# Patient Record
Sex: Male | Born: 1945 | Race: White | Hispanic: No | Marital: Married | Smoking: Former smoker
Health system: Southern US, Community
[De-identification: ages and names within clinical notes are randomized; demographics above are authoritative.]

## PROBLEM LIST (undated history)

## (undated) DIAGNOSIS — I10 Essential (primary) hypertension: Secondary | ICD-10-CM

## (undated) DIAGNOSIS — M069 Rheumatoid arthritis, unspecified: Secondary | ICD-10-CM

## (undated) DIAGNOSIS — J449 Chronic obstructive pulmonary disease, unspecified: Secondary | ICD-10-CM

## (undated) DIAGNOSIS — I509 Heart failure, unspecified: Secondary | ICD-10-CM

## (undated) HISTORY — PX: LEFT HEART CATH: CATH118248

---

## 2001-08-21 ENCOUNTER — Encounter: Payer: Self-pay | Admitting: Occupational Medicine

## 2001-08-21 ENCOUNTER — Encounter: Admission: RE | Admit: 2001-08-21 | Discharge: 2001-08-21 | Payer: Self-pay | Admitting: Occupational Medicine

## 2006-01-04 ENCOUNTER — Encounter: Admission: RE | Admit: 2006-01-04 | Discharge: 2006-01-04 | Payer: Self-pay | Admitting: Sports Medicine

## 2006-01-15 ENCOUNTER — Encounter: Admission: RE | Admit: 2006-01-15 | Discharge: 2006-01-15 | Payer: Self-pay | Admitting: Sports Medicine

## 2018-02-16 DIAGNOSIS — I5031 Acute diastolic (congestive) heart failure: Secondary | ICD-10-CM | POA: Diagnosis not present

## 2018-02-16 DIAGNOSIS — J9601 Acute respiratory failure with hypoxia: Secondary | ICD-10-CM | POA: Diagnosis not present

## 2018-02-16 DIAGNOSIS — Z720 Tobacco use: Secondary | ICD-10-CM | POA: Diagnosis not present

## 2018-02-16 DIAGNOSIS — R2243 Localized swelling, mass and lump, lower limb, bilateral: Secondary | ICD-10-CM | POA: Diagnosis not present

## 2018-02-16 DIAGNOSIS — R0602 Shortness of breath: Secondary | ICD-10-CM

## 2018-02-16 DIAGNOSIS — J441 Chronic obstructive pulmonary disease with (acute) exacerbation: Secondary | ICD-10-CM | POA: Diagnosis not present

## 2018-02-17 DIAGNOSIS — R2243 Localized swelling, mass and lump, lower limb, bilateral: Secondary | ICD-10-CM | POA: Diagnosis not present

## 2018-02-17 DIAGNOSIS — J9601 Acute respiratory failure with hypoxia: Secondary | ICD-10-CM | POA: Diagnosis not present

## 2018-02-17 DIAGNOSIS — Z720 Tobacco use: Secondary | ICD-10-CM | POA: Diagnosis not present

## 2018-02-17 DIAGNOSIS — J441 Chronic obstructive pulmonary disease with (acute) exacerbation: Secondary | ICD-10-CM | POA: Diagnosis not present

## 2018-02-17 DIAGNOSIS — I5031 Acute diastolic (congestive) heart failure: Secondary | ICD-10-CM | POA: Diagnosis not present

## 2019-01-01 ENCOUNTER — Other Ambulatory Visit: Payer: Self-pay

## 2019-01-01 ENCOUNTER — Encounter (HOSPITAL_COMMUNITY): Payer: Self-pay | Admitting: Internal Medicine

## 2019-01-01 ENCOUNTER — Inpatient Hospital Stay (HOSPITAL_COMMUNITY)
Admission: AD | Admit: 2019-01-01 | Discharge: 2019-01-05 | DRG: 191 | Disposition: A | Discharge status: Home / Self Care | Payer: Medicare Other | Source: Other Acute Inpatient Hospital | Attending: Internal Medicine | Admitting: Internal Medicine

## 2019-01-01 DIAGNOSIS — Z20828 Contact with and (suspected) exposure to other viral communicable diseases: Secondary | ICD-10-CM

## 2019-01-01 DIAGNOSIS — E785 Hyperlipidemia, unspecified: Secondary | ICD-10-CM | POA: Diagnosis not present

## 2019-01-01 DIAGNOSIS — I5033 Acute on chronic diastolic (congestive) heart failure: Secondary | ICD-10-CM | POA: Diagnosis present

## 2019-01-01 DIAGNOSIS — I10 Essential (primary) hypertension: Secondary | ICD-10-CM | POA: Diagnosis present

## 2019-01-01 DIAGNOSIS — Z8249 Family history of ischemic heart disease and other diseases of the circulatory system: Secondary | ICD-10-CM

## 2019-01-01 DIAGNOSIS — Z79899 Other long term (current) drug therapy: Secondary | ICD-10-CM | POA: Diagnosis not present

## 2019-01-01 DIAGNOSIS — Z833 Family history of diabetes mellitus: Secondary | ICD-10-CM

## 2019-01-01 DIAGNOSIS — J441 Chronic obstructive pulmonary disease with (acute) exacerbation: Principal | ICD-10-CM | POA: Diagnosis present

## 2019-01-01 DIAGNOSIS — Z9981 Dependence on supplemental oxygen: Secondary | ICD-10-CM | POA: Diagnosis not present

## 2019-01-01 DIAGNOSIS — I5032 Chronic diastolic (congestive) heart failure: Secondary | ICD-10-CM | POA: Diagnosis not present

## 2019-01-01 DIAGNOSIS — M791 Myalgia, unspecified site: Secondary | ICD-10-CM | POA: Diagnosis not present

## 2019-01-01 DIAGNOSIS — Z20822 Contact with and (suspected) exposure to covid-19: Secondary | ICD-10-CM | POA: Diagnosis present

## 2019-01-01 DIAGNOSIS — R6889 Other general symptoms and signs: Secondary | ICD-10-CM

## 2019-01-01 DIAGNOSIS — Z888 Allergy status to other drugs, medicaments and biological substances status: Secondary | ICD-10-CM | POA: Diagnosis not present

## 2019-01-01 DIAGNOSIS — J9611 Chronic respiratory failure with hypoxia: Secondary | ICD-10-CM | POA: Diagnosis present

## 2019-01-01 DIAGNOSIS — Z03818 Encounter for observation for suspected exposure to other biological agents ruled out: Secondary | ICD-10-CM | POA: Diagnosis not present

## 2019-01-01 DIAGNOSIS — F17200 Nicotine dependence, unspecified, uncomplicated: Secondary | ICD-10-CM | POA: Diagnosis present

## 2019-01-01 DIAGNOSIS — M069 Rheumatoid arthritis, unspecified: Secondary | ICD-10-CM | POA: Diagnosis not present

## 2019-01-01 DIAGNOSIS — K5900 Constipation, unspecified: Secondary | ICD-10-CM | POA: Diagnosis not present

## 2019-01-01 DIAGNOSIS — I110 Hypertensive heart disease with heart failure: Secondary | ICD-10-CM | POA: Diagnosis present

## 2019-01-01 HISTORY — DX: Heart failure, unspecified: I50.9

## 2019-01-01 HISTORY — DX: Rheumatoid arthritis, unspecified: M06.9

## 2019-01-01 HISTORY — DX: Chronic obstructive pulmonary disease, unspecified: J44.9

## 2019-01-01 HISTORY — DX: Essential (primary) hypertension: I10

## 2019-01-01 LAB — MRSA PCR SCREENING: MRSA by PCR: NEGATIVE

## 2019-01-01 MED ORDER — CARVEDILOL 3.125 MG PO TABS
3.1250 mg | ORAL_TABLET | Freq: Two times a day (BID) | ORAL | Status: DC
  Administered 2019-01-02 – 2019-01-05 (×7): 3.125 mg via ORAL
  Filled 2019-01-01 (×7): qty 1

## 2019-01-01 MED ORDER — ACETAMINOPHEN 325 MG PO TABS
650.0000 mg | ORAL_TABLET | Freq: Four times a day (QID) | ORAL | Status: DC | PRN
  Administered 2019-01-02: 650 mg via ORAL
  Filled 2019-01-01: qty 2

## 2019-01-01 MED ORDER — HYDRALAZINE HCL 20 MG/ML IJ SOLN
10.0000 mg | INTRAMUSCULAR | Status: DC | PRN
  Administered 2019-01-01 – 2019-01-02 (×2): 10 mg via INTRAVENOUS
  Filled 2019-01-01 (×2): qty 1

## 2019-01-01 MED ORDER — ATORVASTATIN CALCIUM 10 MG PO TABS
10.0000 mg | ORAL_TABLET | Freq: Every day | ORAL | Status: DC
  Administered 2019-01-02 – 2019-01-04 (×3): 10 mg via ORAL
  Filled 2019-01-01 (×3): qty 1

## 2019-01-01 MED ORDER — ENOXAPARIN SODIUM 40 MG/0.4ML ~~LOC~~ SOLN
40.0000 mg | SUBCUTANEOUS | Status: DC
  Administered 2019-01-01: 40 mg via SUBCUTANEOUS
  Filled 2019-01-01: qty 0.4

## 2019-01-01 MED ORDER — FUROSEMIDE 40 MG PO TABS
40.0000 mg | ORAL_TABLET | Freq: Every day | ORAL | Status: DC
  Administered 2019-01-02 – 2019-01-05 (×4): 40 mg via ORAL
  Filled 2019-01-01 (×5): qty 1

## 2019-01-01 MED ORDER — HYDROCODONE-ACETAMINOPHEN 5-325 MG PO TABS
1.0000 | ORAL_TABLET | Freq: Four times a day (QID) | ORAL | Status: DC | PRN
  Administered 2019-01-01 – 2019-01-04 (×10): 1 via ORAL
  Filled 2019-01-01 (×11): qty 1

## 2019-01-01 MED ORDER — MOMETASONE FURO-FORMOTEROL FUM 200-5 MCG/ACT IN AERO
2.0000 | INHALATION_SPRAY | Freq: Two times a day (BID) | RESPIRATORY_TRACT | Status: DC
  Administered 2019-01-01 – 2019-01-05 (×8): 2 via RESPIRATORY_TRACT
  Filled 2019-01-01 (×2): qty 8.8

## 2019-01-01 MED ORDER — IPRATROPIUM-ALBUTEROL 20-100 MCG/ACT IN AERS
1.0000 | INHALATION_SPRAY | Freq: Four times a day (QID) | RESPIRATORY_TRACT | Status: DC
  Administered 2019-01-01 – 2019-01-04 (×13): 1 via RESPIRATORY_TRACT
  Filled 2019-01-01: qty 4

## 2019-01-01 MED ORDER — GUAIFENESIN-DM 100-10 MG/5ML PO SYRP
10.0000 mL | ORAL_SOLUTION | ORAL | Status: DC | PRN
  Administered 2019-01-02 – 2019-01-05 (×9): 10 mL via ORAL
  Filled 2019-01-01 (×11): qty 10

## 2019-01-01 MED ORDER — ORAL CARE MOUTH RINSE
15.0000 mL | Freq: Two times a day (BID) | OROMUCOSAL | Status: DC
  Administered 2019-01-02 – 2019-01-05 (×5): 15 mL via OROMUCOSAL

## 2019-01-01 MED ORDER — ALBUTEROL SULFATE HFA 108 (90 BASE) MCG/ACT IN AERS
1.0000 | INHALATION_SPRAY | RESPIRATORY_TRACT | Status: DC | PRN
  Administered 2019-01-02 – 2019-01-03 (×4): 2 via RESPIRATORY_TRACT
  Filled 2019-01-01: qty 6.7

## 2019-01-01 NOTE — H&P (Addendum)
History and Physical    William Peterson ZMO:[id number] DOB: 09/24/1946 DOA: 01/01/2019  PCP: Patient, No Pcp Per  Patient coming from: [facility]  I have personally briefly reviewed patient's old medical records in [facility]  Chief Complaint: SOB  HPI: William Peterson is a 73 y.o. male with medical history significant of COPD on 2L home O2 at baseline, HTN, CHF, RA not on immunosuppressives.  Patient presents to the ED at [facility] with 1 day h/o SOB, cough, myalgias.  No subjective fever (objectively 100.5 in ED).  Chronic SOB but significantly worse symptoms onset last night.  Cough with clear sputum production.  CP in L anterior and lateral chest only with coughing, severe and sharp when it occurs.  Improved slightly with norco.  No N/V abd pain.  No known sick contacts, no recent travel outside of area.   ED Course: Tm 100.5, RR 26.  WBC 13.9k, CXR neg.  Bicarb 38.  CRP and LDH elevated, procalcitonin neg.  Trop neg.  Pro BNP wnl.  Flu swab neg.  Given nebs and steroids.  Patient transferred to [facility] for COVID r/o as apparently [facility] is now transferring all patients with possible COVID.   Review of Systems: As per HPI otherwise 10 point review of systems negative.   Past Medical History:  Diagnosis Date  . CHF (congestive heart failure) (HCC)   . COPD (chronic obstructive pulmonary disease) (HCC)    2L O2 at baseline  . HTN (hypertension)   . RA (rheumatoid arthritis) (HCC)    Not on immunosuppresants currently it looks like    Past Surgical History:  Procedure Laterality Date  . LEFT HEART CATH       reports that he has been smoking. He does not have any smokeless tobacco history on file. He reports previous alcohol use. He reports that he does not use drugs.  Allergies  Allergen Reactions  . Naproxen Sodium Rash    Family History  Problem Relation Age of Onset  . Heart disease Father   . Diabetes Brother   . Cancer Maternal Grandmother   . Hypertension Neg Hx   .  Stroke Neg Hx      Prior to Admission medications   Medication Sig Start Date End Date Taking? Authorizing Provider  acetaminophen-codeine (TYLENOL #3) 300-30 MG tablet Take 2 tablets by mouth every 4 (four) hours as needed for pain. 06/03/16  Yes [provider]  albuterol (PROVENTIL HFA;VENTOLIN HFA) 108 (90 Base) MCG/ACT inhaler Inhale 2 puffs into the lungs every 6 (six) hours as needed for wheezing or shortness of breath.   Yes [provider]  albuterol (PROVENTIL) (2.5 MG/3ML) 0.083% nebulizer solution Take 2.5 mg by nebulization every 6 (six) hours as needed for wheezing or shortness of breath.   Yes [provider]  atorvastatin (LIPITOR) 10 MG tablet Take 10 mg by mouth daily. 08/04/17  Yes [provider]  carvedilol (COREG) 3.125 MG tablet Take 3.125 mg by mouth 2 (two) times daily. 02/17/18  Yes [provider]  furosemide (LASIX) 20 MG tablet Take 40 mg by mouth daily.   Yes [provider]    Physical Exam: Vitals:   01/01/19 2100 01/01/19 2133  Temp:  97.6 F (36.4 C)  TempSrc:  Axillary  Weight: (!) 152.4 kg   Height: 5\' 10"  (1.778 m)     Constitutional: NAD, calm, comfortable Eyes: PERRL, lids and conjunctivae normal ENMT: Mucous membranes are moist. Posterior pharynx clear of any  exudate or lesions.Normal dentition.  Neck: normal, supple, no masses, no thyromegaly Respiratory: Wheezing, mild tachypnea Cardiovascular: Regular rate and rhythm, no murmurs / rubs / gallops. No extremity edema. 2+ pedal pulses. No carotid bruits.  Abdomen: no tenderness, no masses palpated. No hepatosplenomegaly. Bowel sounds positive.  Musculoskeletal: no clubbing / cyanosis. No joint deformity upper and lower extremities. Good ROM, no contractures. Normal muscle tone.  Skin: no rashes, lesions, ulcers. No induration Neurologic: CN 2-12 grossly intact. Sensation intact, DTR normal. Strength 5/5 in all 4.  Psychiatric: Normal judgment  and insight. Alert and oriented x 3. Normal mood.   Date: @TODAY @ 10:24 PM  Patient Isolation: Droplet+Contact HCP PPE: Wearing PPE N95 mask + eye protection + Gown + Gloves  Patient PPE: None   Labs on Admission: I have personally reviewed following labs and imaging studies  CBC: No results for input(s): WBC, NEUTROABS, HGB, HCT, MCV, PLT in the last 168 hours. Basic Metabolic Panel: No results for input(s): NA, K, CL, CO2, GLUCOSE, BUN, CREATININE, CALCIUM, MG, PHOS in the last 168 hours. GFR: CrCl cannot be calculated (No successful lab value found.). Liver Function Tests: No results for input(s): AST, ALT, ALKPHOS, BILITOT, PROT, ALBUMIN in the last 168 hours. No results for input(s): LIPASE, AMYLASE in the last 168 hours. No results for input(s): AMMONIA in the last 168 hours. Coagulation Profile: No results for input(s): INR, PROTIME in the last 168 hours. Cardiac Enzymes: No results for input(s): CKTOTAL, CKMB, CKMBINDEX, TROPONINI in the last 168 hours. BNP (last 3 results) No results for input(s): PROBNP in the last 8760 hours. HbA1C: No results for input(s): HGBA1C in the last 72 hours. CBG: No results for input(s): GLUCAP in the last 168 hours. Lipid Profile: No results for input(s): CHOL, HDL, LDLCALC, TRIG, CHOLHDL, LDLDIRECT in the last 72 hours. Thyroid Function Tests: No results for input(s): TSH, T4TOTAL, FREET4, T3FREE, THYROIDAB in the last 72 hours. Anemia Panel: No results for input(s): VITAMINB12, FOLATE, FERRITIN, TIBC, IRON, RETICCTPCT in the last 72 hours. Urine analysis: No results found for: COLORURINE, APPEARANCEUR, LABSPEC, PHURINE, GLUCOSEU, HGBUR, BILIRUBINUR, KETONESUR, PROTEINUR, UROBILINOGEN, NITRITE, LEUKOCYTESUR  Radiological Exams on Admission: No results found.  EKG: Independently reviewed.  Assessment/Plan Principal Problem:   Suspected Covid-19 Virus Infection Active Problems:   COPD with acute exacerbation (HCC)   Chronic  diastolic CHF (congestive heart failure) (HCC)   Chronic respiratory failure with hypoxia (HCC)   HTN (hypertension)    1. Ruleout COVID-19 1. COVID pathway 2. RVP and covid testing ordered 3. Monitor pulse ox 4. Robitussin 5. PRN norco for the pleuritic CP 2. COPD exacerbation - 1. Atrovent Scheduled 2. Albuterol PRN 3. Dulera scheduled 4. Avoiding systemic steroids 3. HTN - 1. Continue coreg 2. Add PRN hydralazine if needed 4. CHF - 1. Continue home lasix  DVT prophylaxis: Lovenox Code Status: Full Family Communication: No family in room Disposition Plan: Home after admit Consults called: None Admission status: Place in 84obs    Cleotis Sparr M. DO [facility]  How to contact the [facility] Attending or Consulting provider 7A - 7P or covering provider during after hours 7P -7A, for this patient?  1. Check the care team in [facility] and look for a) attending/consulting [facility] provider listed and b) the [facility] team listed 2. Log into [email or web address]  Amion Physician Scheduling and messaging for groups and whole hospitals  On call and physician scheduling software for group practices, residents, hospitalists and other medical providers for call, clinic, rotation and  shift schedules. On[employer] is a hospital-wide system for scheduling doctors and paging doctors on call. EasyPlot is for scientific plotting and data analysis.  [email or web address]  and use [place]'s universal password to access. If you do not have the password, please contact the hospital operator.  3. Locate the [facility] provider you are looking for under [facility] and page to a number that you can be directly reached. 4. If you still have difficulty reaching the provider, please page the [facility] (Director on Call) for the Hospitalists listed on amion for assistance.  01/01/2019, 9:34 PM

## 2019-01-01 NOTE — Plan of Care (Signed)
  Problem: Education: Goal: Knowledge of General Education information will improve Description: Including pain rating scale, medication(s)/side effects and non-pharmacologic comfort measures Outcome: Progressing   Problem: Nutrition: Goal: Adequate nutrition will be maintained Outcome: Progressing   Problem: Elimination: Goal: Will not experience complications related to urinary retention Outcome: Progressing   Problem: Pain Managment: Goal: General experience of comfort will improve Outcome: Progressing   Problem: Safety: Goal: Ability to remain free from injury will improve Outcome: Progressing   Problem: Skin Integrity: Goal: Risk for impaired skin integrity will decrease Outcome: Progressing   

## 2019-01-02 ENCOUNTER — Encounter (HOSPITAL_COMMUNITY): Payer: Self-pay

## 2019-01-02 DIAGNOSIS — Z888 Allergy status to other drugs, medicaments and biological substances status: Secondary | ICD-10-CM | POA: Diagnosis not present

## 2019-01-02 DIAGNOSIS — R6889 Other general symptoms and signs: Secondary | ICD-10-CM | POA: Diagnosis not present

## 2019-01-02 DIAGNOSIS — I5032 Chronic diastolic (congestive) heart failure: Secondary | ICD-10-CM | POA: Diagnosis present

## 2019-01-02 DIAGNOSIS — M069 Rheumatoid arthritis, unspecified: Secondary | ICD-10-CM | POA: Diagnosis present

## 2019-01-02 DIAGNOSIS — K5900 Constipation, unspecified: Secondary | ICD-10-CM | POA: Diagnosis not present

## 2019-01-02 DIAGNOSIS — Z79899 Other long term (current) drug therapy: Secondary | ICD-10-CM | POA: Diagnosis not present

## 2019-01-02 DIAGNOSIS — J9611 Chronic respiratory failure with hypoxia: Secondary | ICD-10-CM | POA: Diagnosis present

## 2019-01-02 DIAGNOSIS — Z833 Family history of diabetes mellitus: Secondary | ICD-10-CM | POA: Diagnosis not present

## 2019-01-02 DIAGNOSIS — I110 Hypertensive heart disease with heart failure: Secondary | ICD-10-CM | POA: Diagnosis present

## 2019-01-02 DIAGNOSIS — Z9981 Dependence on supplemental oxygen: Secondary | ICD-10-CM | POA: Diagnosis not present

## 2019-01-02 DIAGNOSIS — Z8249 Family history of ischemic heart disease and other diseases of the circulatory system: Secondary | ICD-10-CM | POA: Diagnosis not present

## 2019-01-02 DIAGNOSIS — J441 Chronic obstructive pulmonary disease with (acute) exacerbation: Secondary | ICD-10-CM | POA: Diagnosis present

## 2019-01-02 DIAGNOSIS — E785 Hyperlipidemia, unspecified: Secondary | ICD-10-CM | POA: Diagnosis present

## 2019-01-02 DIAGNOSIS — F17200 Nicotine dependence, unspecified, uncomplicated: Secondary | ICD-10-CM | POA: Diagnosis present

## 2019-01-02 DIAGNOSIS — M791 Myalgia, unspecified site: Secondary | ICD-10-CM | POA: Diagnosis present

## 2019-01-02 DIAGNOSIS — I10 Essential (primary) hypertension: Secondary | ICD-10-CM | POA: Diagnosis not present

## 2019-01-02 DIAGNOSIS — Z03818 Encounter for observation for suspected exposure to other biological agents ruled out: Secondary | ICD-10-CM | POA: Diagnosis not present

## 2019-01-02 LAB — RESPIRATORY PANEL BY PCR

## 2019-01-02 LAB — CBC WITH DIFFERENTIAL/PLATELET
ABS IMMATURE GRANULOCYTES: 0.06 10*3/uL (ref 0.00–0.07)
BASOS PCT: 0 %
Basophils Absolute: 0 10*3/uL (ref 0.0–0.1)
Eosinophils Absolute: 0 10*3/uL (ref 0.0–0.5)
Eosinophils Relative: 0 %
HCT: 41.6 % (ref 39.0–52.0)
Hemoglobin: 12.8 g/dL — ABNORMAL LOW (ref 13.0–17.0)
Immature Granulocytes: 1 %
Lymphocytes Relative: 6 %
Lymphs Abs: 0.6 10*3/uL — ABNORMAL LOW (ref 0.7–4.0)
MCH: 29.2 pg (ref 26.0–34.0)
MCHC: 30.8 g/dL (ref 30.0–36.0)
MCV: 94.8 fL (ref 80.0–100.0)
Monocytes Absolute: 0.3 10*3/uL (ref 0.1–1.0)
Monocytes Relative: 3 %
NEUTROS ABS: 9.2 10*3/uL — AB (ref 1.7–7.7)
NEUTROS PCT: 90 %
Platelets: 246 10*3/uL (ref 150–400)
RBC: 4.39 MIL/uL (ref 4.22–5.81)
RDW: 13.6 % (ref 11.5–15.5)
WBC: 10.1 10*3/uL (ref 4.0–10.5)
nRBC: 0 % (ref 0.0–0.2)

## 2019-01-02 LAB — COMPREHENSIVE METABOLIC PANEL
ALT: 12 U/L (ref 0–44)
AST: 18 U/L (ref 15–41)
Albumin: 2.9 g/dL — ABNORMAL LOW (ref 3.5–5.0)
Alkaline Phosphatase: 54 U/L (ref 38–126)
Anion gap: 9 (ref 5–15)
BUN: 23 mg/dL (ref 8–23)
CO2: 28 mmol/L (ref 22–32)
Calcium: 8.5 mg/dL — ABNORMAL LOW (ref 8.9–10.3)
Chloride: 101 mmol/L (ref 98–111)
Creatinine, Ser: 0.78 mg/dL (ref 0.61–1.24)
GFR calc non Af Amer: >60 mL/min (ref >60)
Glucose, Bld: 132 mg/dL — ABNORMAL HIGH (ref 70–99)
Potassium: 3.6 mmol/L (ref 3.5–5.1)
Sodium: 138 mmol/L (ref 135–145)
Total Bilirubin: 1.1 mg/dL (ref 0.3–1.2)
Total Protein: 6.5 g/dL (ref 6.5–8.1)

## 2019-01-02 LAB — C-REACTIVE PROTEIN: CRP: 14.6 mg/dL — ABNORMAL HIGH (ref <1.0)

## 2019-01-02 MED ORDER — ENOXAPARIN SODIUM 80 MG/0.8ML ~~LOC~~ SOLN
80.0000 mg | SUBCUTANEOUS | Status: DC
  Administered 2019-01-02 – 2019-01-04 (×3): 80 mg via SUBCUTANEOUS
  Filled 2019-01-02 (×3): qty 0.8

## 2019-01-02 MED ORDER — DM-GUAIFENESIN ER 30-600 MG PO TB12
1.0000 | ORAL_TABLET | Freq: Two times a day (BID) | ORAL | Status: DC
  Administered 2019-01-02 – 2019-01-05 (×7): 1 via ORAL
  Filled 2019-01-02 (×7): qty 1

## 2019-01-02 MED ORDER — METHYLPREDNISOLONE SODIUM SUCC 40 MG IJ SOLR
40.0000 mg | Freq: Two times a day (BID) | INTRAMUSCULAR | Status: DC
  Administered 2019-01-02 – 2019-01-04 (×4): 40 mg via INTRAVENOUS
  Filled 2019-01-02 (×4): qty 1

## 2019-01-02 NOTE — Progress Notes (Signed)
At 1110, I was given verbal permission by the pt to call and update William Peterson of his current condition.

## 2019-01-02 NOTE — Progress Notes (Signed)
At 1330, I was given permission by the pt to update his daughter, Geri Seminole, regarding his condition.

## 2019-01-02 NOTE — Care Management CC44 (Signed)
Condition Code 44 Documentation Completed  Patient Details  Name: William Peterson MRN: [id number] Date of Birth: 05-30-1946   Condition Code 44 given:  Yes Patient signature on Condition Code 44 notice:  Yes Documentation of 2 MD's agreement:  Yes Code 44 added to claim:  Yes    Rilea Arutyunyan, RN 01/02/2019, 11:44 AM

## 2019-01-02 NOTE — Care Management Obs Status (Signed)
MEDICARE OBSERVATION STATUS NOTIFICATION   Patient Details  Name: NAWEED DOLLARHIDE MRN: [id number] Date of Birth: 04-Feb-1946   Medicare Observation Status Notification Given:  Yes    Geni Bers, RN 01/02/2019, 11:45 AM

## 2019-01-02 NOTE — Progress Notes (Signed)
PROGRESS NOTE    William BarreRobert L Peterson  HYQ:[id number] DOB: [birth year] DOA: 01/01/2019 PCP: William Peterson, No Pcp Per   Brief Narrative:  73 y.o. male with medical history significant of COPD on 2L home O2 at baseline, HTN, CHF, RA not on immunosuppressives.  [facility]/o SOB, cough, myalgias.  No subjective fever (objectively 100.5 in ED).  Chronic SOB but significantly worse symptoms onset last night.  Cough with clear sputum production.  CP in L anterior and lateral chest only with coughing, severe and sharp when it occurs.  Improved slightly with norco.  No N/V abd pain.  No known sick contacts, no recent travel outside of area.   ED Course: Tm 100.5, RR 26.  WBC 13.9k, CXR neg.  Bicarb 38.  CRP and LDH elevated, procalcitonin neg.  Trop neg.  Pro BNP wnl.  Flu swab neg.  Given nebs and steroids.  [facility] as apparently [facility] is now transferring all patients with possible COVID.  Assessment & Plan:   Principal Problem:   Suspected Covid-19 Virus Infection Active Problems:   COPD with acute exacerbation (HCC)   Chronic diastolic CHF (congestive heart failure) (HCC)   Chronic respiratory failure with hypoxia (HCC)   HTN (hypertension)   #1 COPD exacerbation William Peterson continues to feel short of breath and have cough productive of clear phlegm.  Flu negative at [place]... RSV panel negative. Continue albuterol HFA I will add Combivent continue Dulera At baseline William Peterson has 2 L of oxygen at home COVID-19 pending.  #2 CHF increase Lasix 40 mg twice a day for 2 days.  William Peterson takes Lasix 40 mg at home.  I do not have an echo on him in epic.  [facility].  #3 hypertension continue Coreg  #4 hyperlipidemia continue Lipitor  PPE gown gloves mask eye shields hair shield  were used  Estimated body mass index is 49.06 kg/m as calculated from the following:   Height as of this encounter: 5\' 10"   (1.778 m).   Weight as of this encounter: 155.1 kg.  DVT prophylaxis: lovenox Code Statusfull Family Communication: None  disposition Plan: Pending clinical improvement COVID-19 pending William Peterson comes from home.  Consultants:   None  Procedures: None Antimicrobials: None  Subjective: Complaints of shortness of breath coughing up white phlegm clear phlegm  Objective: Vitals:   01/02/19 0540 01/02/19 0648 01/02/19 0818 01/02/19 1207  BP: (!) 183/82 (!) 115/55 134/79 (!) 144/70  Pulse: 85 96 90 74  Resp: 18 18  19   Temp: 97.7 F (36.5 C)   97.7 F (36.5 C)  TempSrc: Oral   Oral  SpO2: 96% 95% 98% 97%  Weight:      Height:        Intake/Output Summary (Last 24 hours) at 01/02/2019 1220 Last data filed at 01/02/2019 1103 Gross per 24 hour  Intake 270 ml  Output 1 ml  Net 269 ml   Filed Weights   01/01/19 2100 01/02/19 0200  Weight: (!) 152.4 kg (!) 155.1 kg    Examination:  General exam: Appears calm and comfortable  Respiratory system: Scattered wheezing decreased breath sounds at the bases to auscultation. Respiratory effort normal. Cardiovascular system: S1 & S2 heard, RRR. No JVD, murmurs, rubs, gallops or clicks. No pedal edema. Gastrointestinal system: Abdomen is nondistended, soft and nontender. No organomegaly or masses felt. Normal bowel sounds heard. Central nervous system: Alert and oriented. No  focal neurological deficits. Extremities 1+ pitting edema skin: No rashes, lesions or ulcers Psychiatry: Judgement and insight appear normal. Mood & affect appropriate.     Data Reviewed: I have personally reviewed following labs and imaging studies  CBC: Recent Labs  Lab 01/02/19 0354  WBC 10.1  NEUTROABS 9.2*  HGB 12.8*  HCT 41.6  MCV 94.8  PLT 246   Basic Metabolic Panel: Recent Labs  Lab 01/02/19 0354  NA 138  K 3.6  CL 101  CO2 28  GLUCOSE 132*  BUN 23  CREATININE 0.78  CALCIUM 8.5*   GFR: Estimated Creatinine Clearance: 126.7 mL/min  (by C-G formula based on SCr of 0.78 mg/dL). Liver Function Tests: Recent Labs  Lab 01/02/19 0354  AST 18  ALT 12  ALKPHOS 54  BILITOT 1.1  PROT 6.5  ALBUMIN 2.9*   No results for input(s): LIPASE, AMYLASE in the last 168 hours. No results for input(s): AMMONIA in the last 168 hours. Coagulation Profile: No results for input(s): INR, PROTIME in the last 168 hours. Cardiac Enzymes: No results for input(s): CKTOTAL, CKMB, CKMBINDEX, TROPONINI in the last 168 hours. BNP (last 3 results) No results for input(s): PROBNP in the last 8760 hours. HbA1C: No results for input(s): HGBA1C in the last 72 hours. CBG: No results for input(s): GLUCAP in the last 168 hours. Lipid Profile: No results for input(s): CHOL, HDL, LDLCALC, TRIG, CHOLHDL, LDLDIRECT in the last 72 hours. Thyroid Function Tests: No results for input(s): TSH, T4TOTAL, FREET4, T3FREE, THYROIDAB in the last 72 hours. Anemia Panel: No results for input(s): VITAMINB12, FOLATE, FERRITIN, TIBC, IRON, RETICCTPCT in the last 72 hours. Sepsis Labs: No results for input(s): PROCALCITON, LATICACIDVEN in the last 168 hours.  Recent Results (from the past 240 hour(s))  MRSA PCR Screening     Status: None   Collection Time: 01/01/19  8:52 PM  Result Value Ref Range Status   MRSA by PCR NEGATIVE NEGATIVE Final    Comment:        The GeneXpert MRSA Assay (FDA approved for NASAL specimens only), is one component of a comprehensive MRSA colonization surveillance program. It is not intended to diagnose MRSA infection nor to guide or monitor treatment for MRSA infections. Performed at [facility], [address] [place], [place] [id number]   Respiratory Panel by PCR     Status: None   Collection Time: 01/01/19  9:29 PM  Result Value Ref Range Status   Adenovirus NOT DETECTED NOT DETECTED Final   Coronavirus 229E NOT DETECTED NOT DETECTED Final    Comment: (NOTE) The Coronavirus on the Respiratory Panel,  DOES NOT test for the novel  Coronavirus (2019 nCoV)    Coronavirus HKU1 NOT DETECTED NOT DETECTED Final   Coronavirus NL63 NOT DETECTED NOT DETECTED Final   Coronavirus OC43 NOT DETECTED NOT DETECTED Final   Metapneumovirus NOT DETECTED NOT DETECTED Final   Rhinovirus / Enterovirus NOT DETECTED NOT DETECTED Final   Influenza A NOT DETECTED NOT DETECTED Final   Influenza B NOT DETECTED NOT DETECTED Final   Parainfluenza Virus 1 NOT DETECTED NOT DETECTED Final   Parainfluenza Virus 2 NOT DETECTED NOT DETECTED Final   Parainfluenza Virus 3 NOT DETECTED NOT DETECTED Final   Parainfluenza Virus 4 NOT DETECTED NOT DETECTED Final   Respiratory Syncytial Virus NOT DETECTED NOT DETECTED Final   Bordetella pertussis NOT DETECTED NOT DETECTED Final   Chlamydophila pneumoniae NOT DETECTED NOT DETECTED Final   Mycoplasma pneumoniae NOT DETECTED NOT  DETECTED Final    Comment: Performed at [facility], [address] [place], [place] [address]         Radiology Studies: No results found.      Scheduled Meds: . atorvastatin  10 mg Oral q1800  . carvedilol  3.125 mg Oral BID WC  . enoxaparin (LOVENOX) injection  80 mg Subcutaneous Q24H  . furosemide  40 mg Oral Daily  . Ipratropium-Albuterol  1 puff Inhalation Q6H  . mouth rinse  15 mL Mouth Rinse BID  . mometasone-formoterol  2 puff Inhalation BID   Continuous Infusions:   LOS: 1 day     Alwyn Ren, MD [facility]  If 7PM-7AM, please contact night-coverage [email or web address] Password [email or web address] 01/02/2019, 12:20 PM

## 2019-01-02 NOTE — Care Management Obs Status (Signed)
MEDICARE OBSERVATION STATUS NOTIFICATION   Patient Details  Name: CORBAN YOTHER MRN: [id number] Date of Birth: May 03, 1946   Medicare Observation Status Notification Given:  Yes    Geni Bers, RN 01/02/2019, 11:44 AM

## 2019-01-03 LAB — C-REACTIVE PROTEIN: CRP: 8.6 mg/dL — ABNORMAL HIGH (ref <1.0)

## 2019-01-03 LAB — COMPREHENSIVE METABOLIC PANEL
ALT: 14 U/L (ref 0–44)
AST: 23 U/L (ref 15–41)
Albumin: 2.9 g/dL — ABNORMAL LOW (ref 3.5–5.0)
Alkaline Phosphatase: 57 U/L (ref 38–126)
Anion gap: 9 (ref 5–15)
BUN: 29 mg/dL — AB (ref 8–23)
CO2: 28 mmol/L (ref 22–32)
Calcium: 8.6 mg/dL — ABNORMAL LOW (ref 8.9–10.3)
Chloride: 99 mmol/L (ref 98–111)
Creatinine, Ser: 0.78 mg/dL (ref 0.61–1.24)
GFR calc Af Amer: >60 mL/min (ref >60)
GFR calc non Af Amer: >60 mL/min (ref >60)
Glucose, Bld: 91 mg/dL (ref 70–99)
Potassium: 4.3 mmol/L (ref 3.5–5.1)
SODIUM: 136 mmol/L (ref 135–145)
Total Bilirubin: 0.7 mg/dL (ref 0.3–1.2)
Total Protein: 6.8 g/dL (ref 6.5–8.1)

## 2019-01-03 LAB — CBC WITH DIFFERENTIAL/PLATELET
Abs Immature Granulocytes: 0.06 10*3/uL (ref 0.00–0.07)
Basophils Absolute: 0 10*3/uL (ref 0.0–0.1)
Basophils Relative: 0 %
Eosinophils Absolute: 0 10*3/uL (ref 0.0–0.5)
Eosinophils Relative: 0 %
HCT: 42.2 % (ref 39.0–52.0)
Hemoglobin: 13.1 g/dL (ref 13.0–17.0)
Immature Granulocytes: 1 %
Lymphocytes Relative: 5 %
Lymphs Abs: 0.7 10*3/uL (ref 0.7–4.0)
MCH: 29.2 pg (ref 26.0–34.0)
MCHC: 31 g/dL (ref 30.0–36.0)
MCV: 94 fL (ref 80.0–100.0)
Monocytes Absolute: 0.4 10*3/uL (ref 0.1–1.0)
Monocytes Relative: 3 %
Neutro Abs: 11.6 10*3/uL — ABNORMAL HIGH (ref 1.7–7.7)
Neutrophils Relative %: 91 %
Platelets: 282 10*3/uL (ref 150–400)
RBC: 4.49 MIL/uL (ref 4.22–5.81)
RDW: 13.5 % (ref 11.5–15.5)
WBC: 12.8 10*3/uL — ABNORMAL HIGH (ref 4.0–10.5)
nRBC: 0 % (ref 0.0–0.2)

## 2019-01-03 NOTE — Progress Notes (Signed)
[facility] PROGRESS NOTE  RUBLE WILDEN IHK:[id number] DOB: 1946/08/01 DOA: 01/01/2019  PCP: Patient, No Pcp Per  Brief History/Interval Summary: 73 y.o.malewith medical history significant ofCOPD on 2L home O2 at baseline, HTN, CHF, RA not on immunosuppressives.  Patient presented to [facility] emergency department with 1 day history of shortness of breath cough myalgias.  Patient was noted to have low-grade fever.  Patient was hospitalized for further management.  Concern for COVID.   Reason for Visit: COPD exacerbation  Consultants: None  Procedures: None  Antibiotics: None  Subjective/Interval History: Patient states that he continues to have a cough with some difficulty breathing.  Denies any wheezing.  ROS: Denies any nausea or vomiting.  Has chronic lower extremity edema.  Objective:  Vital Signs  Vitals:   01/02/19 1649 01/02/19 2204 01/03/19 0556 01/03/19 0820  BP:  (!) 145/63 (!) 184/75 (!) 161/70  Pulse: 63 72 85 70  Resp: 18  16 18   Temp:  97.7 F (36.5 C) 98.2 F (36.8 C) 97.9 F (36.6 C)  TempSrc:  Oral Oral Oral  SpO2: 97% 95% 92% 96%  Weight:      Height:        Intake/Output Summary (Last 24 hours) at 01/03/2019 1301 Last data filed at 01/03/2019 1040 Gross per 24 hour  Intake 1020 ml  Output 425 ml  Net 595 ml   Filed Weights   01/01/19 2100 01/02/19 0200  Weight: (!) 152.4 kg (!) 155.1 kg    General appearance: alert, cooperative, appears stated age, distracted and morbidly obese Head: Normocephalic, without obvious abnormality, atraumatic Resp: Mildly tachypneic.  Few crackles at the bases.  No wheezing or rhonchi. Cardio: regular rate and rhythm, S1, S2 normal, no murmur, click, rub or gallop GI: soft, non-tender; bowel sounds normal; no masses,  no organomegaly Extremities: 1-2+ pitting edema bilateral lower extremity which is at baseline according to patient Pulses: 2+ and symmetric Neurologic: Alert and oriented  x3.  No focal neurological deficits.  Lab Results:  Data Reviewed: I have personally reviewed following labs and imaging studies  CBC: Recent Labs  Lab 01/02/19 0354 01/03/19 0351  WBC 10.1 12.8*  NEUTROABS 9.2* 11.6*  HGB 12.8* 13.1  HCT 41.6 42.2  MCV 94.8 94.0  PLT 246 282    Basic Metabolic Panel: Recent Labs  Lab 01/02/19 0354 01/03/19 0351  NA 138 136  K 3.6 4.3  CL 101 99  CO2 28 28  GLUCOSE 132* 91  BUN 23 29*  CREATININE 0.78 0.78  CALCIUM 8.5* 8.6*    GFR: Estimated Creatinine Clearance: 126.7 mL/min (by C-G formula based on SCr of 0.78 mg/dL).  Liver Function Tests: Recent Labs  Lab 01/02/19 0354 01/03/19 0351  AST 18 23  ALT 12 14  ALKPHOS 54 57  BILITOT 1.1 0.7  PROT 6.5 6.8  ALBUMIN 2.9* 2.9*     Recent Results (from the past 240 hour(s))  MRSA PCR Screening     Status: None   Collection Time: 01/01/19  8:52 PM  Result Value Ref Range Status   MRSA by PCR NEGATIVE NEGATIVE Final    Comment:        The GeneXpert MRSA Assay (FDA approved for NASAL specimens only), is one component of a comprehensive MRSA colonization surveillance program. It is not intended to diagnose MRSA infection nor to guide or monitor treatment for MRSA infections. Performed at [facility], [address] [place], [place] [address]   Respiratory  Panel by PCR     Status: None   Collection Time: 01/01/19  9:29 PM  Result Value Ref Range Status   Adenovirus NOT DETECTED NOT DETECTED Final   Coronavirus 229E NOT DETECTED NOT DETECTED Final    Comment: (NOTE) The Coronavirus on the Respiratory Panel, DOES NOT test for the novel  Coronavirus (2019 nCoV)    Coronavirus HKU1 NOT DETECTED NOT DETECTED Final   Coronavirus NL63 NOT DETECTED NOT DETECTED Final   Coronavirus OC43 NOT DETECTED NOT DETECTED Final   Metapneumovirus NOT DETECTED NOT DETECTED Final   Rhinovirus / Enterovirus NOT DETECTED NOT DETECTED Final   Influenza A NOT  DETECTED NOT DETECTED Final   Influenza B NOT DETECTED NOT DETECTED Final   Parainfluenza Virus 1 NOT DETECTED NOT DETECTED Final   Parainfluenza Virus 2 NOT DETECTED NOT DETECTED Final   Parainfluenza Virus 3 NOT DETECTED NOT DETECTED Final   Parainfluenza Virus 4 NOT DETECTED NOT DETECTED Final   Respiratory Syncytial Virus NOT DETECTED NOT DETECTED Final   Bordetella pertussis NOT DETECTED NOT DETECTED Final   Chlamydophila pneumoniae NOT DETECTED NOT DETECTED Final   Mycoplasma pneumoniae NOT DETECTED NOT DETECTED Final    Comment: Performed at [facility], [address], [place], [place] [address]      Radiology Studies: No results found.   Medications:  Scheduled: . atorvastatin  10 mg Oral q1800  . carvedilol  3.125 mg Oral BID WC  . dextromethorphan-guaiFENesin  1 tablet Oral BID  . enoxaparin (LOVENOX) injection  80 mg Subcutaneous Q24H  . furosemide  40 mg Oral Daily  . Ipratropium-Albuterol  1 puff Inhalation Q6H  . mouth rinse  15 mL Mouth Rinse BID  . methylPREDNISolone (SOLU-MEDROL) injection  40 mg Intravenous Q12H  . mometasone-formoterol  2 puff Inhalation BID   Continuous:  UEA:[id number], albuterol, guaiFENesin-dextromethorphan, hydrALAZINE, HYDROcodone-acetaminophen    Assessment/Plan:  PPE Date: 01/03/2019  Patient Isolation: Droplet+Contact  HCP NFA:[id number] a facemask, wearing gown and gloves. wearing eye protection Patient PPE: None   Acute COPD exacerbation Patient does not have any wheezing currently.  Patient remains only on inhalers at this time.  Seems to be stable from a respiratory standpoint.  He is on home oxygen at 2 L/min.  His influenza and respiratory viral panel were negative.  Coronavirus test is pending.  Continue steroids for now.  Possible diastolic congestive heart failure Patient does have significant lower extremity edema which is at baseline according to him.  There is been no worsening.  Continue with oral Lasix  for now.  Continue to monitor.  No clear indication to order inpatient echocardiogram at this time.  History of essential hypertension Continue Coreg.  Hyperlipidemia Continue Lipitor.   DVT Prophylaxis: Lovenox    Code Status: Full code Family Communication: Discussed with the patient Disposition Plan: Management as outlined above    LOS: 2 days   Wyndi Northrup [employer] on [email or web address]  01/03/2019, 1:01 PM

## 2019-01-04 LAB — CBC WITH DIFFERENTIAL/PLATELET
Abs Immature Granulocytes: 0.07 10*3/uL (ref 0.00–0.07)
Basophils Absolute: 0 10*3/uL (ref 0.0–0.1)
Basophils Relative: 0 %
Eosinophils Absolute: 0 10*3/uL (ref 0.0–0.5)
Eosinophils Relative: 0 %
HCT: 42.2 % (ref 39.0–52.0)
Hemoglobin: 12.6 g/dL — ABNORMAL LOW (ref 13.0–17.0)
Immature Granulocytes: 1 %
Lymphocytes Relative: 7 %
Lymphs Abs: 0.8 10*3/uL (ref 0.7–4.0)
MCH: 28.8 pg (ref 26.0–34.0)
MCHC: 29.9 g/dL — ABNORMAL LOW (ref 30.0–36.0)
MCV: 96.3 fL (ref 80.0–100.0)
Monocytes Absolute: 0.5 10*3/uL (ref 0.1–1.0)
Monocytes Relative: 4 %
Neutro Abs: 10.2 10*3/uL — ABNORMAL HIGH (ref 1.7–7.7)
Neutrophils Relative %: 88 %
Platelets: 301 10*3/uL (ref 150–400)
RBC: 4.38 MIL/uL (ref 4.22–5.81)
RDW: 13.4 % (ref 11.5–15.5)
WBC: 11.5 10*3/uL — ABNORMAL HIGH (ref 4.0–10.5)
nRBC: 0 % (ref 0.0–0.2)

## 2019-01-04 LAB — COMPREHENSIVE METABOLIC PANEL
ALT: 14 U/L (ref 0–44)
AST: 19 U/L (ref 15–41)
Albumin: 2.6 g/dL — ABNORMAL LOW (ref 3.5–5.0)
Alkaline Phosphatase: 48 U/L (ref 38–126)
Anion gap: 7 (ref 5–15)
BUN: 29 mg/dL — ABNORMAL HIGH (ref 8–23)
CO2: 29 mmol/L (ref 22–32)
Calcium: 8.3 mg/dL — ABNORMAL LOW (ref 8.9–10.3)
Chloride: 99 mmol/L (ref 98–111)
Creatinine, Ser: 0.83 mg/dL (ref 0.61–1.24)
GFR calc Af Amer: >60 mL/min (ref >60)
GFR calc non Af Amer: >60 mL/min (ref >60)
Glucose, Bld: 96 mg/dL (ref 70–99)
Potassium: 4.3 mmol/L (ref 3.5–5.1)
Sodium: 135 mmol/L (ref 135–145)
Total Bilirubin: 0.4 mg/dL (ref 0.3–1.2)
Total Protein: 5.9 g/dL — ABNORMAL LOW (ref 6.5–8.1)

## 2019-01-04 LAB — C-REACTIVE PROTEIN: CRP: 3.7 mg/dL — ABNORMAL HIGH (ref <1.0)

## 2019-01-04 LAB — NOVEL CORONAVIRUS, NAA (HOSP ORDER, SEND-OUT TO REF LAB; TAT 18-24 HRS): SARS-CoV-2, NAA: NOT DETECTED

## 2019-01-04 MED ORDER — BENZONATATE 100 MG PO CAPS
100.0000 mg | ORAL_CAPSULE | Freq: Three times a day (TID) | ORAL | Status: DC
  Administered 2019-01-04 – 2019-01-05 (×3): 100 mg via ORAL
  Filled 2019-01-04 (×3): qty 1

## 2019-01-04 MED ORDER — BENZONATATE 100 MG PO CAPS: 100.0000 mg | ORAL_CAPSULE | Freq: Three times a day (TID) | ORAL | Status: DC | PRN

## 2019-01-04 MED ORDER — POLYETHYLENE GLYCOL 3350 17 G PO PACK
17.0000 g | PACK | Freq: Every day | ORAL | Status: DC
  Administered 2019-01-04 – 2019-01-05 (×2): 17 g via ORAL
  Filled 2019-01-04 (×2): qty 1

## 2019-01-04 MED ORDER — DOCUSATE SODIUM 100 MG PO CAPS
100.0000 mg | ORAL_CAPSULE | Freq: Two times a day (BID) | ORAL | Status: DC
  Administered 2019-01-04 – 2019-01-05 (×3): 100 mg via ORAL
  Filled 2019-01-04 (×3): qty 1

## 2019-01-04 MED ORDER — IPRATROPIUM-ALBUTEROL 20-100 MCG/ACT IN AERS
1.0000 | INHALATION_SPRAY | Freq: Three times a day (TID) | RESPIRATORY_TRACT | Status: DC
  Administered 2019-01-05 (×2): 1 via RESPIRATORY_TRACT

## 2019-01-04 MED ORDER — PREDNISONE 20 MG PO TABS
40.0000 mg | ORAL_TABLET | Freq: Two times a day (BID) | ORAL | Status: DC
  Administered 2019-01-04 – 2019-01-05 (×2): 40 mg via ORAL
  Filled 2019-01-04 (×2): qty 2

## 2019-01-04 MED ORDER — HYDROCODONE-ACETAMINOPHEN 5-325 MG PO TABS
1.0000 | ORAL_TABLET | ORAL | Status: DC | PRN
  Administered 2019-01-04 – 2019-01-05 (×3): 1 via ORAL
  Filled 2019-01-04 (×3): qty 1

## 2019-01-04 NOTE — Care Management Important Message (Signed)
Important Message  Patient Details  Name: JABARRI INTERRANTE MRN: [id number] Date of Birth: Feb 23, 1946   Medicare Important Message Given:  Yes    Caren Macadam 01/04/2019, 5:17 PMImportant Message  Patient Details  Name: ANTAWAN LAMONS MRN: [id number] Date of Birth: Jan 30, 1946   Medicare Important Message Given:  Yes    Caren Macadam 01/04/2019, 5:17 PM

## 2019-01-04 NOTE — Progress Notes (Signed)
[facility] PROGRESS NOTE  Yetta BarreRobert L Schirm ZOX:[id number] DOB: 03-Aug-1946 DOA: 01/01/2019  PCP: Patient, No Pcp Per  Brief History/Interval Summary: 73 y.o.malewith medical history significant ofCOPD on 2L home O2 at baseline, HTN, CHF, RA not on immunosuppressives.  Patient presented to [facility] with 1 day history of shortness of breath cough myalgias.  Patient was noted to have low-grade fever.  Patient was hospitalized for further management.  Concern for COVID.   Reason for Visit: COPD exacerbation  Consultants: None  Procedures: None  Antibiotics: None  Subjective/Interval History: Patient states that he continues to have cough and when he coughs he has pain in his left lower chest and left upper back area.  This is been chronic for him. He has had this kind of pain for many months.  Denies any wheezing.  ROS: Denies any nausea or vomiting.  He does complain of constipation.  Objective:  Vital Signs  Vitals:   01/03/19 2106 01/04/19 0548 01/04/19 0859 01/04/19 1232  BP: 135/62 (!) 145/80  (!) 124/54  Pulse: 71 68 65 (!) 57  Resp: 20 18  20   Temp: 97.8 F (36.6 C) 98 F (36.7 C)  97.9 F (36.6 C)  TempSrc: Oral Oral  Oral  SpO2: 94% 97%  97%  Weight:      Height:        Intake/Output Summary (Last 24 hours) at 01/04/2019 1240 Last data filed at 01/04/2019 1017 Gross per 24 hour  Intake 1200 ml  Output 1050 ml  Net 150 ml   Filed Weights   01/01/19 2100 01/02/19 0200  Weight: (!) 152.4 kg (!) 155.1 kg   General appearance: Awake alert.  In no distress.  Morbidly obese Resp: Normal effort at rest.  Diminished air entry at the bases.  No wheezing or rhonchi.  Few crackles appreciated.   Cardio: S1-S2 is normal regular.  No S3-S4.  No rubs murmurs or bruit GI: Abdomen is soft.  Nontender nondistended.  Bowel sounds are present normal.  No masses organomegaly Extremities: 1-2+ pitting edema bilateral lower extremities which  is at baseline according to patient. Neurologic: Alert and oriented x3.  No focal neurological deficits.     Lab Results:  Data Reviewed: I have personally reviewed following labs and imaging studies  CBC: Recent Labs  Lab 01/02/19 0354 01/03/19 0351 01/04/19 0324  WBC 10.1 12.8* 11.5*  NEUTROABS 9.2* 11.6* 10.2*  HGB 12.8* 13.1 12.6*  HCT 41.6 42.2 42.2  MCV 94.8 94.0 96.3  PLT 246 282 301    Basic Metabolic Panel: Recent Labs  Lab 01/02/19 0354 01/03/19 0351 01/04/19 0324  NA 138 136 135  K 3.6 4.3 4.3  CL 101 99 99  CO2 28 28 29   GLUCOSE 132* 91 96  BUN 23 29* 29*  CREATININE 0.78 0.78 0.83  CALCIUM 8.5* 8.6* 8.3*    GFR: Estimated Creatinine Clearance: 122.2 mL/min (by C-G formula based on SCr of 0.83 mg/dL).  Liver Function Tests: Recent Labs  Lab 01/02/19 0354 01/03/19 0351 01/04/19 0324  AST 18 23 19   ALT 12 14 14   ALKPHOS 54 57 48  BILITOT 1.1 0.7 0.4  PROT 6.5 6.8 5.9*  ALBUMIN 2.9* 2.9* 2.6*     Recent Results (from the past 240 hour(s))  MRSA PCR Screening     Status: None   Collection Time: 01/01/19  8:52 PM  Result Value Ref Range Status   MRSA by PCR NEGATIVE NEGATIVE Final  Comment:        The GeneXpert MRSA Assay (FDA approved for NASAL specimens only), is one component of a comprehensive MRSA colonization surveillance program. It is not intended to diagnose MRSA infection nor to guide or monitor treatment for MRSA infections. Performed at [facility], [address], [place], [place] [address]   Novel Coronavirus, NAA (hospital order; send-out to ref lab)     Status: None   Collection Time: 01/01/19  9:28 PM  Result Value Ref Range Status   SARS-CoV-2, NAA NOT DETECTED NOT DETECTED Final    Comment: Negative (Not Detected) results do not exclude infection caused by SARS CoV 2 and should not be used as the sole basis for treatment or other patient management decisions. Optimum specimen types and timing  for peak viral levels during infections caused  by SARS CoV 2 have not been determined. Collection of multiple specimens (types and time points) from the same patient may be necessary to detect the virus. Improper specimen collection and handling, sequence variability underlying assay primers and or probes, or the presence of organisms in  quantities less than the limit of detection of the assay may lead to false negative results. Positive and negative predictive values of testing are highly dependent on prevalence. False negative results are more likely when prevalence of disease is high. (NOTE) The expected result is Negative (Not Detected). The SARS CoV 2 test is intended for the presumptive qualitative  detection of nucleic acid from SARS CoV 2 in upper and lower  respir atory specimens. Testing methodology is real time RT PCR. Test results must be correlated with clinical presentation and  evaluated in the context of other laboratory and epidemiologic data.  Test performance can be affected because the epidemiology and  clinical spectrum of infection caused by SARS CoV 2 is not fully  known. For example, the optimum types of specimens to collect and  when during the course of infection these specimens are most likely  to contain detectable viral RNA may not be known. This test has not been Food and Drug Administration (FDA) cleared or  approved and has been authorized by FDA under an Emergency Use  Authorization (EUA). The test is only authorized for the duration of  the declaration that circumstances exist justifying the authorization  of emergency use of in vitro diagnostic tests for detection and or  diagnosis of SARS CoV 2 under Section 564(b)(1) of the Act, 21 U.S.C.  section [phone] 3(b)(1), unless the authorization is terminated or   revoked sooner. [facility] is certified under the  Clinical Laboratory Improvement Amendments of 1988 (CLIA), 42 U.S.C.  section  2510244746, to perform high complexity tests. Performed at [facility] CLIA [id number] [address], [place], [place] [address] Laboratory Director: Turner Daniels, MD Performed at [facility], [address] [place], [place] [address]    Coronavirus Source NASOPHARYNGEAL  Final    Comment: Performed at [facility], [address], [place], [place] [address]  Respiratory Panel by PCR     Status: None   Collection Time: 01/01/19  9:29 PM  Result Value Ref Range Status   Adenovirus NOT DETECTED NOT DETECTED Final   Coronavirus 229E NOT DETECTED NOT DETECTED Final    Comment: (NOTE) The Coronavirus on the Respiratory Panel, DOES NOT test for the novel  Coronavirus (2019 nCoV)    Coronavirus HKU1 NOT DETECTED NOT DETECTED Final  Coronavirus NL63 NOT DETECTED NOT DETECTED Final   Coronavirus OC43 NOT DETECTED NOT DETECTED Final   Metapneumovirus NOT DETECTED NOT DETECTED Final   Rhinovirus / Enterovirus NOT DETECTED NOT DETECTED Final   Influenza A NOT DETECTED NOT DETECTED Final   Influenza B NOT DETECTED NOT DETECTED Final   Parainfluenza Virus 1 NOT DETECTED NOT DETECTED Final   Parainfluenza Virus 2 NOT DETECTED NOT DETECTED Final   Parainfluenza Virus 3 NOT DETECTED NOT DETECTED Final   Parainfluenza Virus 4 NOT DETECTED NOT DETECTED Final   Respiratory Syncytial Virus NOT DETECTED NOT DETECTED Final   Bordetella pertussis NOT DETECTED NOT DETECTED Final   Chlamydophila pneumoniae NOT DETECTED NOT DETECTED Final   Mycoplasma pneumoniae NOT DETECTED NOT DETECTED Final    Comment: Performed at [facility], [address] [place], [place] [address]      Radiology Studies: No results found.   Medications:  Scheduled: . atorvastatin  10 mg Oral q1800  . carvedilol  3.125 mg Oral BID WC  . dextromethorphan-guaiFENesin  1 tablet Oral BID  . enoxaparin (LOVENOX) injection  80 mg Subcutaneous Q24H  .  furosemide  40 mg Oral Daily  . Ipratropium-Albuterol  1 puff Inhalation Q6H  . mouth rinse  15 mL Mouth Rinse BID  . methylPREDNISolone (SOLU-MEDROL) injection  40 mg Intravenous Q12H  . mometasone-formoterol  2 puff Inhalation BID   Continuous:  UEA:[id number]:acetaminophen, albuterol, guaiFENesin-dextromethorphan, hydrALAZINE, HYDROcodone-acetaminophen    Assessment/Plan:  PPE Date: 01/04/2019  Patient Isolation: Droplet+Contact  HCP PPE: Face mask, eye protection, gown and gloves.   Patient PPE: None   Acute COPD exacerbation Patient appears to be stable.  His pleuritic symptoms are chronic as per patient.  Has had this for many months.  Stable from a respiratory standpoint.  He is on oxygen chronically at home at 2 L/min.  Influenza and respiratory viral panel negative.  COVID 19 has returned back negative.  Continue steroids.  Possible diastolic congestive heart failure Patient does have significant lower extremity edema which is at baseline according to him.  There is been no worsening.  Continue with oral Lasix for now.  Continue to monitor.  No clear indication to order inpatient echocardiogram at this time.  Constipation Possibly due to pain medications.  Stool softeners and laxatives.  History of essential hypertension Continue Coreg.  Hyperlipidemia Continue Lipitor.   DVT Prophylaxis: Lovenox    Code Status: Full code Family Communication: Discussed with the patient Disposition Plan: Management as outlined above.  Anticipate discharge tomorrow.    LOS: 3 days   Tandi Hanko [employer]Krishnan  [facility] Pager on [email or web address]  01/04/2019, 12:40 PM

## 2019-01-04 NOTE — Progress Notes (Signed)
I have spoken with pt's PCP, nursing staff, reviewed patient's chart. They have a clear alternative diagnosis and COVID precautions are being d/c.  COVID test (-). Back to his home FiO2 (2L) and sating well (97%). He has been afebrile in hospital.

## 2019-01-05 MED ORDER — MOMETASONE FURO-FORMOTEROL FUM 200-5 MCG/ACT IN AERO: 2.0000 | INHALATION_SPRAY | Freq: Two times a day (BID) | RESPIRATORY_TRACT | 0 refills | Status: AC

## 2019-01-05 MED ORDER — PREDNISONE 20 MG PO TABS: 40.0000 mg | ORAL_TABLET | Freq: Every day | ORAL | 0 refills | Status: AC

## 2019-01-05 MED ORDER — DM-GUAIFENESIN ER 30-600 MG PO TB12: 1.0000 | ORAL_TABLET | Freq: Two times a day (BID) | ORAL | 0 refills | Status: AC

## 2019-01-05 MED ORDER — HYDROCODONE-ACETAMINOPHEN 5-325 MG PO TABS: 1.0000 | ORAL_TABLET | ORAL | 0 refills | Status: AC | PRN

## 2019-01-05 MED ORDER — HYDROCODONE-ACETAMINOPHEN 5-325 MG PO TABS
1.0000 | ORAL_TABLET | ORAL | Status: DC | PRN
  Administered 2019-01-05: 2 via ORAL
  Filled 2019-01-05: qty 2

## 2019-01-05 MED ORDER — BENZONATATE 100 MG PO CAPS: 100.0000 mg | ORAL_CAPSULE | Freq: Three times a day (TID) | ORAL | 0 refills | Status: DC

## 2019-01-05 NOTE — Discharge Summary (Signed)
Physician Discharge Summary  William Peterson:[id number] DOB: 1945/10/21 DOA: 01/01/2019  PCP: Patient, No Pcp Per  Admit date: 01/01/2019 Discharge date: 01/05/2019  Admitted From: Home Disposition: Home  Recommendations for Outpatient Follow-up:  1. Follow up with PCP in 1-2 weeks 2. Please obtain BMP/CBC in one week 3. Please follow up on the following pending results:  Home Health: No Equipment/Devices: 2 L nasal cannula  Discharge Condition: Stable CODE STATUS: Full Diet recommendation: Heart Healthy   Brief/Interim Summary:  #) COPD exacerbation: Patient was admitted with 1 day of cough, shortness of breath, myalgias as well as low-grade fevers.  Due to circulating novel coronavirus patient was transferred here from [facility] and was tested for coronavirus.  This testing was negative.  Patient was treated for COPD exacerbation.  He was given IV steroids and then transition to p.o.  He was given scheduled short-acting bronchodilators.  He was also started on ICS/LABA.  He was discharged home to complete a course of oral steroids and told to continue short acting bronchodilators.  #) Hypertension/hyperlipidemia: Patient was continued on carvedilol and atorvastatin.  #) Rheumatoid arthritis: Patient was on any treatment for this.  Discharge Diagnoses:  Principal Problem:   Suspected Covid-19 Virus Infection Active Problems:   COPD with acute exacerbation (HCC)   Chronic diastolic CHF (congestive heart failure) (HCC)   Chronic respiratory failure with hypoxia (HCC)   HTN (hypertension)    Discharge Instructions  Discharge Instructions    Call MD for:  difficulty breathing, headache or visual disturbances   Complete by:  As directed    Call MD for:  hives   Complete by:  As directed    Call MD for:  persistant dizziness or light-headedness   Complete by:  As directed    Call MD for:  persistant nausea and vomiting   Complete by:  As directed    Call MD  for:  redness, tenderness, or signs of infection (pain, swelling, redness, odor or green/yellow discharge around incision site)   Complete by:  As directed    Call MD for:  severe uncontrolled pain   Complete by:  As directed    Call MD for:  temperature >100.4   Complete by:  As directed    Diet - low sodium heart healthy   Complete by:  As directed    Discharge instructions   Complete by:  As directed    Please follow-up with your primary care doctor in 1 to 2 weeks.  Please take your steroids as prescribed.   Increase activity slowly   Complete by:  As directed      Allergies as of 01/05/2019      Reactions   Naproxen Sodium Rash      Medication List    STOP taking these medications   acetaminophen-codeine 300-30 MG tablet Commonly known as:  TYLENOL #3     TAKE these medications   albuterol 108 (90 Base) MCG/ACT inhaler Commonly known as:  PROVENTIL HFA;VENTOLIN HFA Inhale 2 puffs into the lungs every 6 (six) hours as needed for wheezing or shortness of breath.   albuterol (2.5 MG/3ML) 0.083% nebulizer solution Commonly known as:  PROVENTIL Take 2.5 mg by nebulization every 6 (six) hours as needed for wheezing or shortness of breath.   atorvastatin 10 MG tablet Commonly known as:  LIPITOR Take 10 mg by mouth daily.   benzonatate 100 MG capsule Commonly known as:  TESSALON Take 1 capsule (100 mg total) by mouth  3 (three) times daily.   carvedilol 3.125 MG tablet Commonly known as:  COREG Take 3.125 mg by mouth 2 (two) times daily.   dextromethorphan-guaiFENesin 30-600 MG 12hr tablet Commonly known as:  MUCINEX DM Take 1 tablet by mouth 2 (two) times daily for 30 days.   furosemide 20 MG tablet Commonly known as:  LASIX Take 40 mg by mouth daily.   HYDROcodone-acetaminophen 5-325 MG tablet Commonly known as:  NORCO/VICODIN Take 1-2 tablets by mouth every 4 (four) hours as needed for up to 5 days for moderate pain or severe pain.   mometasone-formoterol  200-5 MCG/ACT Aero Commonly known as:  DULERA Inhale 2 puffs into the lungs 2 (two) times daily for 30 days.   predniSONE 20 MG tablet Commonly known as:  DELTASONE Take 2 tablets (40 mg total) by mouth daily with breakfast for 5 days.       Allergies  Allergen Reactions  . Naproxen Sodium Rash    Consultations: None  Procedures/Studies:  No results found.    Subjective:   Discharge Exam: Vitals:   01/04/19 2028 01/05/19 0550  BP: (!) 141/76 (!) 151/71  Pulse: 65 63  Resp: 20 20  Temp: 97.9 F (36.6 C) 98.4 F (36.9 C)  SpO2: 95% 94%   Vitals:   01/04/19 1232 01/04/19 2019 01/04/19 2028 01/05/19 0550  BP: (!) 124/54  (!) 141/76 (!) 151/71  Pulse: (!) 57  65 63  Resp: 20  20 20   Temp: 97.9 F (36.6 C)  97.9 F (36.6 C) 98.4 F (36.9 C)  TempSrc: Oral  Oral Oral  SpO2: 97% 93% 95% 94%  Weight:      Height:        General: Pt is alert, awake, not in acute distress Cardiovascular: RRR, S1/S2 +, no rubs, no gallops Respiratory: CTA bilaterally, no wheezing, no rhonchi Abdominal: Soft, NT, ND, bowel sounds + Extremities: 2+ lower extremity edema    The results of significant diagnostics from this hospitalization (including imaging, microbiology, ancillary and laboratory) are listed below for reference.     Microbiology: Recent Results (from the past 240 hour(s))  MRSA PCR Screening     Status: None   Collection Time: 01/01/19  8:52 PM  Result Value Ref Range Status   MRSA by PCR NEGATIVE NEGATIVE Final    Comment:        The GeneXpert MRSA Assay (FDA approved for NASAL specimens only), is one component of a comprehensive MRSA colonization surveillance program. It is not intended to diagnose MRSA infection nor to guide or monitor treatment for MRSA infections. Performed at [facility], [address] [place], [place] [address]   Novel Coronavirus, NAA (hospital order; send-out to ref lab)     Status: None   Collection  Time: 01/01/19  9:28 PM  Result Value Ref Range Status   SARS-CoV-2, NAA NOT DETECTED NOT DETECTED Final    Comment: Negative (Not Detected) results do not exclude infection caused by SARS CoV 2 and should not be used as the sole basis for treatment or other patient management decisions. Optimum specimen types and timing for peak viral levels during infections caused  by SARS CoV 2 have not been determined. Collection of multiple specimens (types and time points) from the same patient may be necessary to detect the virus. Improper specimen collection and handling, sequence variability underlying assay primers and or probes, or the presence of organisms in  quantities less than the limit of detection of  the assay may lead to false negative results. Positive and negative predictive values of testing are highly dependent on prevalence. False negative results are more likely when prevalence of disease is high. (NOTE) The expected result is Negative (Not Detected). The SARS CoV 2 test is intended for the presumptive qualitative  detection of nucleic acid from SARS CoV 2 in upper and lower  respir atory specimens. Testing methodology is real time RT PCR. Test results must be correlated with clinical presentation and  evaluated in the context of other laboratory and epidemiologic data.  Test performance can be affected because the epidemiology and  clinical spectrum of infection caused by SARS CoV 2 is not fully  known. For example, the optimum types of specimens to collect and  when during the course of infection these specimens are most likely  to contain detectable viral RNA may not be known. This test has not been Food and Drug Administration (FDA) cleared or  approved and has been authorized by FDA under an Emergency Use  Authorization (EUA). The test is only authorized for the duration of  the declaration that circumstances exist justifying the authorization  of emergency use of in vitro  diagnostic tests for detection and or  diagnosis of SARS CoV 2 under Section 564(b)(1) of the Act, 21 U.S.C.  section [phone] 3(b)(1), unless the authorization is terminated or   revoked sooner. [facility] is certified under the  Clinical Laboratory Improvement Amendments of 1988 (CLIA), 42 U.S.C.  section [id number], to perform high complexity tests. Performed at [facility] CLIA [id number] [address], [place], [place] [address] Laboratory Director: Turner Daniels, MD Performed at [facility], [address] [place], [place] [address]    Coronavirus Source NASOPHARYNGEAL  Final    Comment: Performed at [facility], [address], [place], [place] [address]  Respiratory Panel by PCR     Status: None   Collection Time: 01/01/19  9:29 PM  Result Value Ref Range Status   Adenovirus NOT DETECTED NOT DETECTED Final   Coronavirus 229E NOT DETECTED NOT DETECTED Final    Comment: (NOTE) The Coronavirus on the Respiratory Panel, DOES NOT test for the novel  Coronavirus (2019 nCoV)    Coronavirus HKU1 NOT DETECTED NOT DETECTED Final   Coronavirus NL63 NOT DETECTED NOT DETECTED Final   Coronavirus OC43 NOT DETECTED NOT DETECTED Final   Metapneumovirus NOT DETECTED NOT DETECTED Final   Rhinovirus / Enterovirus NOT DETECTED NOT DETECTED Final   Influenza A NOT DETECTED NOT DETECTED Final   Influenza B NOT DETECTED NOT DETECTED Final   Parainfluenza Virus 1 NOT DETECTED NOT DETECTED Final   Parainfluenza Virus 2 NOT DETECTED NOT DETECTED Final   Parainfluenza Virus 3 NOT DETECTED NOT DETECTED Final   Parainfluenza Virus 4 NOT DETECTED NOT DETECTED Final   Respiratory Syncytial Virus NOT DETECTED NOT DETECTED Final   Bordetella pertussis NOT DETECTED NOT DETECTED Final   Chlamydophila pneumoniae NOT DETECTED NOT DETECTED Final   Mycoplasma pneumoniae NOT DETECTED NOT DETECTED Final    Comment: Performed at  [facility], [address], [place], [place] [address]     Labs: BNP (last 3 results) No results for input(s): BNP in the last 8760 hours. Basic Metabolic Panel: Recent Labs  Lab 01/02/19 0354 01/03/19 0351 01/04/19 0324  NA 138 136 135  K 3.6 4.3 4.3  CL 101 99 99  CO2 28 28  29  GLUCOSE 132* 91 96  BUN 23 29* 29*  CREATININE 0.78 0.78 0.83  CALCIUM 8.5* 8.6* 8.3*   Liver Function Tests: Recent Labs  Lab 01/02/19 0354 01/03/19 0351 01/04/19 0324  AST 18 23 19   ALT 12 14 14   ALKPHOS 54 57 48  BILITOT 1.1 0.7 0.4  PROT 6.5 6.8 5.9*  ALBUMIN 2.9* 2.9* 2.6*   No results for input(s): LIPASE, AMYLASE in the last 168 hours. No results for input(s): AMMONIA in the last 168 hours. CBC: Recent Labs  Lab 01/02/19 0354 01/03/19 0351 01/04/19 0324  WBC 10.1 12.8* 11.5*  NEUTROABS 9.2* 11.6* 10.2*  HGB 12.8* 13.1 12.6*  HCT 41.6 42.2 42.2  MCV 94.8 94.0 96.3  PLT 246 282 301   Cardiac Enzymes: No results for input(s): CKTOTAL, CKMB, CKMBINDEX, TROPONINI in the last 168 hours. BNP: Invalid input(s): POCBNP CBG: No results for input(s): GLUCAP in the last 168 hours. D-Dimer No results for input(s): DDIMER in the last 72 hours. Hgb A1c No results for input(s): HGBA1C in the last 72 hours. Lipid Profile No results for input(s): CHOL, HDL, LDLCALC, TRIG, CHOLHDL, LDLDIRECT in the last 72 hours. Thyroid function studies No results for input(s): TSH, T4TOTAL, T3FREE, THYROIDAB in the last 72 hours.  Invalid input(s): FREET3 Anemia work up No results for input(s): VITAMINB12, FOLATE, FERRITIN, TIBC, IRON, RETICCTPCT in the last 72 hours. Urinalysis No results found for: COLORURINE, APPEARANCEUR, LABSPEC, PHURINE, GLUCOSEU, HGBUR, BILIRUBINUR, KETONESUR, PROTEINUR, UROBILINOGEN, NITRITE, LEUKOCYTESUR Sepsis Labs Invalid input(s): PROCALCITONIN,  WBC,  LACTICIDVEN Microbiology Recent Results (from the past 240 hour(s))  MRSA PCR Screening     Status: None    Collection Time: 01/01/19  8:52 PM  Result Value Ref Range Status   MRSA by PCR NEGATIVE NEGATIVE Final    Comment:        The GeneXpert MRSA Assay (FDA approved for NASAL specimens only), is one component of a comprehensive MRSA colonization surveillance program. It is not intended to diagnose MRSA infection nor to guide or monitor treatment for MRSA infections. Performed at [facility], [address] [place], [place] [address]   Novel Coronavirus, NAA (hospital order; send-out to ref lab)     Status: None   Collection Time: 01/01/19  9:28 PM  Result Value Ref Range Status   SARS-CoV-2, NAA NOT DETECTED NOT DETECTED Final    Comment: Negative (Not Detected) results do not exclude infection caused by SARS CoV 2 and should not be used as the sole basis for treatment or other patient management decisions. Optimum specimen types and timing for peak viral levels during infections caused  by SARS CoV 2 have not been determined. Collection of multiple specimens (types and time points) from the same patient may be necessary to detect the virus. Improper specimen collection and handling, sequence variability underlying assay primers and or probes, or the presence of organisms in  quantities less than the limit of detection of the assay may lead to false negative results. Positive and negative predictive values of testing are highly dependent on prevalence. False negative results are more likely when prevalence of disease is high. (NOTE) The expected result is Negative (Not Detected). The SARS CoV 2 test is intended for the presumptive qualitative  detection of nucleic acid from SARS CoV 2 in upper and lower  respir atory specimens. Testing methodology is real time RT PCR. Test results must be correlated with clinical presentation and  evaluated in the context of other  laboratory and epidemiologic data.  Test performance can be affected because the epidemiology and   clinical spectrum of infection caused by SARS CoV 2 is not fully  known. For example, the optimum types of specimens to collect and  when during the course of infection these specimens are most likely  to contain detectable viral RNA may not be known. This test has not been Food and Drug Administration (FDA) cleared or  approved and has been authorized by FDA under an Emergency Use  Authorization (EUA). The test is only authorized for the duration of  the declaration that circumstances exist justifying the authorization  of emergency use of in vitro diagnostic tests for detection and or  diagnosis of SARS CoV 2 under Section 564(b)(1) of the Act, 21 U.S.C.  section [phone] 3(b)(1), unless the authorization is terminated or   revoked sooner. [facility] is certified under the  Clinical Laboratory Improvement Amendments of 1988 (CLIA), 42 U.S.C.  section [phone], to perform high complexity tests. Performed at [facility] CLIA [id number] [address], [place], [place] [address] Laboratory Director: Turner Daniels, MD Performed at [facility], [address], [place], [place] [address]    Coronavirus Source NASOPHARYNGEAL  Final    Comment: Performed at [facility], [address], [place], [place] [address]  Respiratory Panel by PCR     Status: None   Collection Time: 01/01/19  9:29 PM  Result Value Ref Range Status   Adenovirus NOT DETECTED NOT DETECTED Final   Coronavirus 229E NOT DETECTED NOT DETECTED Final    Comment: (NOTE) The Coronavirus on the Respiratory Panel, DOES NOT test for the novel  Coronavirus (2019 nCoV)    Coronavirus HKU1 NOT DETECTED NOT DETECTED Final   Coronavirus NL63 NOT DETECTED NOT DETECTED Final   Coronavirus OC43 NOT DETECTED NOT DETECTED Final   Metapneumovirus NOT DETECTED NOT DETECTED Final   Rhinovirus / Enterovirus NOT DETECTED NOT DETECTED Final   Influenza A NOT  DETECTED NOT DETECTED Final   Influenza B NOT DETECTED NOT DETECTED Final   Parainfluenza Virus 1 NOT DETECTED NOT DETECTED Final   Parainfluenza Virus 2 NOT DETECTED NOT DETECTED Final   Parainfluenza Virus 3 NOT DETECTED NOT DETECTED Final   Parainfluenza Virus 4 NOT DETECTED NOT DETECTED Final   Respiratory Syncytial Virus NOT DETECTED NOT DETECTED Final   Bordetella pertussis NOT DETECTED NOT DETECTED Final   Chlamydophila pneumoniae NOT DETECTED NOT DETECTED Final   Mycoplasma pneumoniae NOT DETECTED NOT DETECTED Final    Comment: Performed at [facility], [address] [place], [place] [address]     Time coordinating discharge: 95  SIGNED:   Delaine Lame, MD  [facility] 01/05/2019, 9:35 AM  If 7PM-7AM, please contact night-coverage [email or web address] Password [email or web address]

## 2019-01-05 NOTE — Discharge Instructions (Signed)

## 2019-01-14 ENCOUNTER — Inpatient Hospital Stay (HOSPITAL_COMMUNITY)
Admission: EM | Admit: 2019-01-14 | Discharge: 2019-01-23 | DRG: 515 | Disposition: A | Discharge status: Skilled Nursing Facility | Payer: Medicare Other | Attending: Student | Admitting: Student

## 2019-01-14 ENCOUNTER — Emergency Department (HOSPITAL_COMMUNITY): Payer: Medicare Other

## 2019-01-14 ENCOUNTER — Other Ambulatory Visit: Payer: Self-pay

## 2019-01-14 ENCOUNTER — Encounter (HOSPITAL_COMMUNITY): Payer: Self-pay

## 2019-01-14 DIAGNOSIS — Z8249 Family history of ischemic heart disease and other diseases of the circulatory system: Secondary | ICD-10-CM

## 2019-01-14 DIAGNOSIS — I130 Hypertensive heart and chronic kidney disease with heart failure and stage 1 through stage 4 chronic kidney disease, or unspecified chronic kidney disease: Secondary | ICD-10-CM | POA: Diagnosis present

## 2019-01-14 DIAGNOSIS — Z6841 Body Mass Index (BMI) 40.0 and over, adult: Secondary | ICD-10-CM

## 2019-01-14 DIAGNOSIS — J181 Lobar pneumonia, unspecified organism: Secondary | ICD-10-CM | POA: Diagnosis not present

## 2019-01-14 DIAGNOSIS — R7989 Other specified abnormal findings of blood chemistry: Secondary | ICD-10-CM

## 2019-01-14 DIAGNOSIS — M2508 Hemarthrosis, other specified site: Secondary | ICD-10-CM | POA: Diagnosis present

## 2019-01-14 DIAGNOSIS — W01198A Fall on same level from slipping, tripping and stumbling with subsequent striking against other object, initial encounter: Secondary | ICD-10-CM | POA: Diagnosis present

## 2019-01-14 DIAGNOSIS — E785 Hyperlipidemia, unspecified: Secondary | ICD-10-CM | POA: Diagnosis present

## 2019-01-14 DIAGNOSIS — I5033 Acute on chronic diastolic (congestive) heart failure: Secondary | ICD-10-CM | POA: Diagnosis present

## 2019-01-14 DIAGNOSIS — S22050K Wedge compression fracture of T5-T6 vertebra, subsequent encounter for fracture with nonunion: Secondary | ICD-10-CM

## 2019-01-14 DIAGNOSIS — R402362 Coma scale, best motor response, obeys commands, at arrival to emergency department: Secondary | ICD-10-CM | POA: Diagnosis present

## 2019-01-14 DIAGNOSIS — S22050A Wedge compression fracture of T5-T6 vertebra, initial encounter for closed fracture: Secondary | ICD-10-CM | POA: Diagnosis present

## 2019-01-14 DIAGNOSIS — Y95 Nosocomial condition: Secondary | ICD-10-CM | POA: Diagnosis present

## 2019-01-14 DIAGNOSIS — J9611 Chronic respiratory failure with hypoxia: Secondary | ICD-10-CM

## 2019-01-14 DIAGNOSIS — I82409 Acute embolism and thrombosis of unspecified deep veins of unspecified lower extremity: Secondary | ICD-10-CM | POA: Diagnosis not present

## 2019-01-14 DIAGNOSIS — T796XXA Traumatic ischemia of muscle, initial encounter: Secondary | ICD-10-CM | POA: Diagnosis not present

## 2019-01-14 DIAGNOSIS — R52 Pain, unspecified: Secondary | ICD-10-CM

## 2019-01-14 DIAGNOSIS — M069 Rheumatoid arthritis, unspecified: Secondary | ICD-10-CM | POA: Diagnosis present

## 2019-01-14 DIAGNOSIS — L89896 Pressure-induced deep tissue damage of other site: Secondary | ICD-10-CM | POA: Diagnosis present

## 2019-01-14 DIAGNOSIS — K7460 Unspecified cirrhosis of liver: Secondary | ICD-10-CM | POA: Diagnosis present

## 2019-01-14 DIAGNOSIS — R402142 Coma scale, eyes open, spontaneous, at arrival to emergency department: Secondary | ICD-10-CM | POA: Diagnosis present

## 2019-01-14 DIAGNOSIS — M549 Dorsalgia, unspecified: Secondary | ICD-10-CM

## 2019-01-14 DIAGNOSIS — N182 Chronic kidney disease, stage 2 (mild): Secondary | ICD-10-CM | POA: Diagnosis present

## 2019-01-14 DIAGNOSIS — L8990 Pressure ulcer of unspecified site, unspecified stage: Secondary | ICD-10-CM

## 2019-01-14 DIAGNOSIS — J449 Chronic obstructive pulmonary disease, unspecified: Secondary | ICD-10-CM

## 2019-01-14 DIAGNOSIS — R5382 Chronic fatigue, unspecified: Secondary | ICD-10-CM | POA: Diagnosis present

## 2019-01-14 DIAGNOSIS — Z79899 Other long term (current) drug therapy: Secondary | ICD-10-CM

## 2019-01-14 DIAGNOSIS — Z7951 Long term (current) use of inhaled steroids: Secondary | ICD-10-CM

## 2019-01-14 DIAGNOSIS — W19XXXA Unspecified fall, initial encounter: Secondary | ICD-10-CM | POA: Diagnosis not present

## 2019-01-14 DIAGNOSIS — Y92002 Bathroom of unspecified non-institutional (private) residence single-family (private) house as the place of occurrence of the external cause: Secondary | ICD-10-CM | POA: Diagnosis not present

## 2019-01-14 DIAGNOSIS — K8020 Calculus of gallbladder without cholecystitis without obstruction: Secondary | ICD-10-CM | POA: Diagnosis present

## 2019-01-14 DIAGNOSIS — R209 Unspecified disturbances of skin sensation: Secondary | ICD-10-CM | POA: Diagnosis not present

## 2019-01-14 DIAGNOSIS — Z833 Family history of diabetes mellitus: Secondary | ICD-10-CM

## 2019-01-14 DIAGNOSIS — E875 Hyperkalemia: Secondary | ICD-10-CM | POA: Diagnosis present

## 2019-01-14 DIAGNOSIS — M6282 Rhabdomyolysis: Secondary | ICD-10-CM | POA: Diagnosis present

## 2019-01-14 DIAGNOSIS — J189 Pneumonia, unspecified organism: Secondary | ICD-10-CM

## 2019-01-14 DIAGNOSIS — Z886 Allergy status to analgesic agent status: Secondary | ICD-10-CM

## 2019-01-14 DIAGNOSIS — Z9981 Dependence on supplemental oxygen: Secondary | ICD-10-CM

## 2019-01-14 DIAGNOSIS — E872 Acidosis, unspecified: Secondary | ICD-10-CM

## 2019-01-14 DIAGNOSIS — M5124 Other intervertebral disc displacement, thoracic region: Secondary | ICD-10-CM | POA: Diagnosis present

## 2019-01-14 DIAGNOSIS — J432 Centrilobular emphysema: Secondary | ICD-10-CM | POA: Diagnosis present

## 2019-01-14 DIAGNOSIS — I739 Peripheral vascular disease, unspecified: Secondary | ICD-10-CM | POA: Diagnosis present

## 2019-01-14 DIAGNOSIS — D72829 Elevated white blood cell count, unspecified: Secondary | ICD-10-CM

## 2019-01-14 DIAGNOSIS — N179 Acute kidney failure, unspecified: Secondary | ICD-10-CM | POA: Diagnosis present

## 2019-01-14 DIAGNOSIS — R778 Other specified abnormalities of plasma proteins: Secondary | ICD-10-CM

## 2019-01-14 DIAGNOSIS — R627 Adult failure to thrive: Secondary | ICD-10-CM | POA: Diagnosis present

## 2019-01-14 DIAGNOSIS — R079 Chest pain, unspecified: Secondary | ICD-10-CM

## 2019-01-14 DIAGNOSIS — I5032 Chronic diastolic (congestive) heart failure: Secondary | ICD-10-CM | POA: Diagnosis not present

## 2019-01-14 DIAGNOSIS — R202 Paresthesia of skin: Secondary | ICD-10-CM

## 2019-01-14 DIAGNOSIS — L89202 Pressure ulcer of unspecified hip, stage 2: Secondary | ICD-10-CM | POA: Diagnosis not present

## 2019-01-14 DIAGNOSIS — R402252 Coma scale, best verbal response, oriented, at arrival to emergency department: Secondary | ICD-10-CM | POA: Diagnosis present

## 2019-01-14 DIAGNOSIS — Z87891 Personal history of nicotine dependence: Secondary | ICD-10-CM

## 2019-01-14 DIAGNOSIS — R200 Anesthesia of skin: Secondary | ICD-10-CM

## 2019-01-14 DIAGNOSIS — S76012A Strain of muscle, fascia and tendon of left hip, initial encounter: Secondary | ICD-10-CM | POA: Diagnosis present

## 2019-01-14 DIAGNOSIS — I509 Heart failure, unspecified: Secondary | ICD-10-CM | POA: Diagnosis not present

## 2019-01-14 DIAGNOSIS — J438 Other emphysema: Secondary | ICD-10-CM | POA: Diagnosis not present

## 2019-01-14 DIAGNOSIS — G8929 Other chronic pain: Secondary | ICD-10-CM | POA: Diagnosis present

## 2019-01-14 DIAGNOSIS — M1712 Unilateral primary osteoarthritis, left knee: Secondary | ICD-10-CM | POA: Diagnosis present

## 2019-01-14 LAB — CBC WITH DIFFERENTIAL/PLATELET
Abs Immature Granulocytes: 0 10*3/uL (ref 0.00–0.07)
Basophils Absolute: 0 10*3/uL (ref 0.0–0.1)
Basophils Relative: 0 %
Eosinophils Absolute: 0 10*3/uL (ref 0.0–0.5)
Eosinophils Relative: 0 %
HCT: 45.4 % (ref 39.0–52.0)
Hemoglobin: 13.4 g/dL (ref 13.0–17.0)
Lymphocytes Relative: 2 %
Lymphs Abs: 0.5 10*3/uL — ABNORMAL LOW (ref 0.7–4.0)
MCH: 28.3 pg (ref 26.0–34.0)
MCHC: 29.5 g/dL — ABNORMAL LOW (ref 30.0–36.0)
MCV: 95.8 fL (ref 80.0–100.0)
Monocytes Absolute: 1 10*3/uL (ref 0.1–1.0)
Monocytes Relative: 4 %
Neutro Abs: 22.6 10*3/uL — ABNORMAL HIGH (ref 1.7–7.7)
Neutrophils Relative %: 94 %
Platelets: 291 10*3/uL (ref 150–400)
RBC: 4.74 MIL/uL (ref 4.22–5.81)
RDW: 15 % (ref 11.5–15.5)
WBC: 24 10*3/uL — ABNORMAL HIGH (ref 4.0–10.5)
nRBC: 0 % (ref 0.0–0.2)
nRBC: 0 /100 WBC

## 2019-01-14 LAB — COMPREHENSIVE METABOLIC PANEL
ALT: 38 U/L (ref 0–44)
AST: 209 U/L — ABNORMAL HIGH (ref 15–41)
Albumin: 2.8 g/dL — ABNORMAL LOW (ref 3.5–5.0)
Alkaline Phosphatase: 59 U/L (ref 38–126)
Anion gap: 16 — ABNORMAL HIGH (ref 5–15)
BUN: 37 mg/dL — ABNORMAL HIGH (ref 8–23)
CO2: 23 mmol/L (ref 22–32)
Calcium: 8.5 mg/dL — ABNORMAL LOW (ref 8.9–10.3)
Chloride: 102 mmol/L (ref 98–111)
Creatinine, Ser: 2.51 mg/dL — ABNORMAL HIGH (ref 0.61–1.24)
GFR calc Af Amer: 29 mL/min — ABNORMAL LOW (ref >60)
GFR calc non Af Amer: 25 mL/min — ABNORMAL LOW (ref >60)
Glucose, Bld: 85 mg/dL (ref 70–99)
Potassium: 5.4 mmol/L — ABNORMAL HIGH (ref 3.5–5.1)
Sodium: 141 mmol/L (ref 135–145)
Total Bilirubin: 2.3 mg/dL — ABNORMAL HIGH (ref 0.3–1.2)
Total Protein: 5.9 g/dL — ABNORMAL LOW (ref 6.5–8.1)

## 2019-01-14 LAB — BRAIN NATRIURETIC PEPTIDE: B Natriuretic Peptide: 128.3 pg/mL — ABNORMAL HIGH (ref 0.0–100.0)

## 2019-01-14 LAB — LACTIC ACID, PLASMA
Lactic Acid, Venous: 5.2 mmol/L (ref 0.5–1.9)
Lactic Acid, Venous: 4.1 mmol/L (ref 0.5–1.9)
Lactic Acid, Venous: 4.2 mmol/L (ref 0.5–1.9)

## 2019-01-14 LAB — CK: Total CK: 10002 U/L — ABNORMAL HIGH (ref 49–397)

## 2019-01-14 LAB — TROPONIN I
Troponin I: 0.07 ng/mL (ref <0.03)
Troponin I: 0.08 ng/mL (ref <0.03)

## 2019-01-14 LAB — MRSA PCR SCREENING: MRSA by PCR: NEGATIVE

## 2019-01-14 MED ORDER — ALBUTEROL SULFATE (2.5 MG/3ML) 0.083% IN NEBU: 2.5000 mg | INHALATION_SOLUTION | Freq: Four times a day (QID) | RESPIRATORY_TRACT | Status: DC | PRN

## 2019-01-14 MED ORDER — MOMETASONE FURO-FORMOTEROL FUM 200-5 MCG/ACT IN AERO
2.0000 | INHALATION_SPRAY | Freq: Two times a day (BID) | RESPIRATORY_TRACT | Status: DC
  Administered 2019-01-15 – 2019-01-23 (×13): 2 via RESPIRATORY_TRACT
  Filled 2019-01-14 (×2): qty 8.8

## 2019-01-14 MED ORDER — CARVEDILOL 3.125 MG PO TABS
3.1250 mg | ORAL_TABLET | Freq: Two times a day (BID) | ORAL | Status: DC
  Administered 2019-01-14 – 2019-01-15 (×2): 3.125 mg via ORAL
  Filled 2019-01-14 (×2): qty 1

## 2019-01-14 MED ORDER — SODIUM CHLORIDE 0.9% FLUSH
3.0000 mL | Freq: Two times a day (BID) | INTRAVENOUS | Status: DC
  Administered 2019-01-14 – 2019-01-23 (×17): 3 mL via INTRAVENOUS

## 2019-01-14 MED ORDER — HEPARIN SODIUM (PORCINE) 5000 UNIT/ML IJ SOLN
5000.0000 [IU] | Freq: Three times a day (TID) | INTRAMUSCULAR | Status: DC
  Administered 2019-01-14 – 2019-01-18 (×12): 5000 [IU] via SUBCUTANEOUS
  Filled 2019-01-14 (×11): qty 1

## 2019-01-14 MED ORDER — SODIUM CHLORIDE 0.9 % IV SOLN
INTRAVENOUS | Status: AC
  Administered 2019-01-14 – 2019-01-15 (×3): via INTRAVENOUS

## 2019-01-14 MED ORDER — ONDANSETRON HCL 4 MG PO TABS
4.0000 mg | ORAL_TABLET | Freq: Four times a day (QID) | ORAL | Status: DC | PRN
  Administered 2019-01-21 – 2019-01-22 (×2): 4 mg via ORAL
  Filled 2019-01-14 (×2): qty 1

## 2019-01-14 MED ORDER — VANCOMYCIN VARIABLE DOSE PER UNSTABLE RENAL FUNCTION (PHARMACIST DOSING): Status: DC

## 2019-01-14 MED ORDER — ONDANSETRON HCL 4 MG/2ML IJ SOLN
4.0000 mg | Freq: Four times a day (QID) | INTRAMUSCULAR | Status: DC | PRN
  Filled 2019-01-14: qty 2

## 2019-01-14 MED ORDER — SODIUM CHLORIDE 0.9 % IV SOLN
2.0000 g | INTRAVENOUS | Status: DC
  Administered 2019-01-14 – 2019-01-16 (×3): 2 g via INTRAVENOUS
  Filled 2019-01-14 (×4): qty 2

## 2019-01-14 MED ORDER — VANCOMYCIN HCL 10 G IV SOLR
2000.0000 mg | Freq: Once | INTRAVENOUS | Status: AC
  Administered 2019-01-14: 2000 mg via INTRAVENOUS
  Filled 2019-01-14: qty 2000

## 2019-01-14 MED ORDER — PIPERACILLIN-TAZOBACTAM 3.375 G IVPB: 3.3750 g | Freq: Three times a day (TID) | INTRAVENOUS | Status: DC

## 2019-01-14 MED ORDER — SODIUM CHLORIDE 0.9 % IV SOLN
INTRAVENOUS | Status: DC | PRN
  Administered 2019-01-14 – 2019-01-16 (×4): 250 mL via INTRAVENOUS
  Administered 2019-01-17 – 2019-01-20 (×4): 1000 mL via INTRAVENOUS
  Administered 2019-01-22 (×2): via INTRAVENOUS

## 2019-01-14 MED ORDER — PIPERACILLIN-TAZOBACTAM 3.375 G IVPB 30 MIN
3.3750 g | Freq: Once | INTRAVENOUS | Status: AC
  Administered 2019-01-14: 3.375 g via INTRAVENOUS
  Filled 2019-01-14: qty 50

## 2019-01-14 MED ORDER — OXYCODONE HCL 5 MG PO TABS
10.0000 mg | ORAL_TABLET | ORAL | Status: AC | PRN
  Administered 2019-01-14 – 2019-01-15 (×3): 10 mg via ORAL
  Filled 2019-01-14 (×3): qty 2

## 2019-01-14 MED ORDER — TRAMADOL HCL 50 MG PO TABS
50.0000 mg | ORAL_TABLET | Freq: Four times a day (QID) | ORAL | Status: DC | PRN
  Administered 2019-01-14 – 2019-01-23 (×10): 50 mg via ORAL
  Filled 2019-01-14 (×10): qty 1

## 2019-01-14 MED ORDER — SODIUM CHLORIDE 0.9 % IV BOLUS
1000.0000 mL | Freq: Once | INTRAVENOUS | Status: AC
  Administered 2019-01-14: 1000 mL via INTRAVENOUS

## 2019-01-14 MED ORDER — SODIUM CHLORIDE 0.9 % IV BOLUS: 1000.0000 mL | Freq: Once | INTRAVENOUS | Status: DC

## 2019-01-14 MED ORDER — ALBUTEROL SULFATE HFA 108 (90 BASE) MCG/ACT IN AERS: 2.0000 | INHALATION_SPRAY | Freq: Four times a day (QID) | RESPIRATORY_TRACT | Status: DC | PRN

## 2019-01-14 NOTE — Progress Notes (Signed)
Pharmacy Antibiotic Note  William Peterson is a 73 y.o. male admitted on 01/14/2019 with sepsis.  Pharmacy has been consulted for vancomycin and zosyn dosing.  AF, WBC 24, RR 24, BP soft, LA 4.1. SCr 2.51 (BL <1)  Plan: Vancomycin 2000mg  IV x 1, then variable dosing based on renal function Zosyn 3.375g IV every 8 hours Monitor renal function, Cx and clinical progression to narrow Vancomycin levels at steady state  Height: 5\' 10"  (177.8 cm) Weight: (!) 341 lb (154.7 kg) IBW/kg (Calculated) : 73  Temp (24hrs), Avg:97.5 F (36.4 C), Min:97.5 F (36.4 C), Max:97.5 F (36.4 C)  Recent Labs  Lab 01/14/19 1058 01/14/19 1115 01/14/19 1300  WBC 24.0*  --   --   CREATININE 2.51*  --   --   LATICACIDVEN  --  5.2* 4.1*    Estimated Creatinine Clearance: 40.4 mL/min (A) (by C-G formula based on SCr of 2.51 mg/dL (H)).    Allergies  Allergen Reactions  . Naproxen Sodium Rash    Antimicrobials this admission: Vanc 4/12>> Zosyn 4/12>>  Dose adjustments this admission: n/a  Microbiology results: 4/12 BCx: sent  Daylene Posey, PharmD Clinical Pharmacist Please check AMION for all [facility] Pharmacy numbers 01/14/2019 2:09 PM

## 2019-01-14 NOTE — ED Notes (Signed)
MD notified of elevated lactic 

## 2019-01-14 NOTE — ED Notes (Signed)
Patient transported to X-ray 

## 2019-01-14 NOTE — Progress Notes (Signed)
Pharmacy Antibiotic Note  William Peterson is a 73 y.o. male admitted on 01/14/2019 with sepsis.  Pharmacy has been consulted for vancomycin and zosyn dosing, now changing zosyn to cefepime.  AF, WBC 24, RR 24, BP soft, LA 4.1. SCr 2.51 (BL <1)  Plan: Cefepime 2gm IV Q24H F/u renal fxn, C&S, clinical status  Height: 5\' 10"  (177.8 cm) Weight: (!) 341 lb (154.7 kg) IBW/kg (Calculated) : 73  Temp (24hrs), Avg:97.5 F (36.4 C), Min:97.5 F (36.4 C), Max:97.5 F (36.4 C)  Recent Labs  Lab 01/14/19 1058 01/14/19 1115 01/14/19 1300  WBC 24.0*  --   --   CREATININE 2.51*  --   --   LATICACIDVEN  --  5.2* 4.1*    Estimated Creatinine Clearance: 40.4 mL/min (A) (by C-G formula based on SCr of 2.51 mg/dL (H)).    Allergies  Allergen Reactions  . Naproxen Sodium Rash    Antimicrobials this admission: Vanc 4/12>> Zosyn x 1 4/12 Cefepime 4/12>>  Dose adjustments this admission: n/a  Microbiology results: 4/12 BCx: sent  Lysle Pearl, PharmD, BCPS Please see AMION for all pharmacy numbers 01/14/2019 3:46 PM

## 2019-01-14 NOTE — ED Notes (Signed)
Pt returned from CT °

## 2019-01-14 NOTE — ED Notes (Signed)
Dr. Tomma Lightning notified of elevated troponin

## 2019-01-14 NOTE — ED Provider Notes (Signed)
[facility] EMERGENCY DEPARTMENT Provider Note   CSN: [id number] Arrival date & time: 01/14/19  1025    History   Chief Complaint Chief Complaint  Patient presents with   Chest Pain   Fall    HPI William Peterson is a 73 y.o. male.     HPI  73yo male with recent admission 4/3 with suspected COPD exacerbation, negative COVID19 testing, with history of COPD, CHF, chronic respiratory failure on 2L O2, htn, RA, who presents with concern for fall last night with inability to get up after fall, continuing right sided chest pain, shortness of breath and cough.    Reports he fell, not sure what time, maybe 1AM. Reports he is not sure how he got back to , that he was in [place] for a family get-together, and seems confused regarding this> Otherwise oriented to date.   Reports right sided chest pain right now which has been ongoing even prior to fall last night. Not sure if he fell on it.  Sharp pain.   Slipped on bathmat and fell backwards hitting toilet and was curled up on floor all night. Reports he has numbness and cannot feel his LLE since his fall last night. Otherwise denies headache, numbness, weakness, change in vision. Has  Back pain now but believes from ED stretcher.  Cough has been ongoing for nearly 3 weeks now.  No fevers.  Shortness of breath has also been ongoing. Both are about the same.  Finished up steroids.  Right sided chest pain periodically over the last few weeks, worse with coughing and deep breaths. Talking also makes it worse. Sharp knifelike pain.  No fevers, no n/v/d/sore throat. Chronic body aches.    Spent night on the toilet, fell onto it and couldn't get up.  Very low position, pain in left hip and right chest.  Felt numbness in left leg after fall.        Past Medical History:  Diagnosis Date   CHF (congestive heart failure) (HCC)    COPD (chronic obstructive pulmonary disease) (HCC)    2L O2 at baseline   HTN  (hypertension)    RA (rheumatoid arthritis) (HCC)    Not on immunosuppresants currently it looks like    Patient Active Problem List   Diagnosis Date Noted   Fall 01/14/2019   HCAP (healthcare-associated pneumonia) 01/14/2019   COPD (chronic obstructive pulmonary disease) (HCC) 01/14/2019   Rhabdomyolysis 01/14/2019   Elevated troponin 01/14/2019   AKI (acute kidney injury) (HCC) 01/14/2019   HLD (hyperlipidemia) 01/14/2019   Chronic diastolic CHF (congestive heart failure) (HCC) 01/01/2019   Chronic respiratory failure with hypoxia (HCC) 01/01/2019   HTN (hypertension) 01/01/2019    Past Surgical History:  Procedure Laterality Date   LEFT HEART CATH          Home Medications    Prior to Admission medications   Medication Sig Start Date End Date Taking? Authorizing Provider  albuterol (PROVENTIL HFA;VENTOLIN HFA) 108 (90 Base) MCG/ACT inhaler Inhale 2 puffs into the lungs every 6 (six) hours as needed for wheezing or shortness of breath.   Yes [provider]  albuterol (PROVENTIL) (2.5 MG/3ML) 0.083% nebulizer solution Take 2.5 mg by nebulization every 6 (six) hours as needed for wheezing or shortness of breath.   Yes [provider]  atorvastatin (LIPITOR) 10 MG tablet Take 10 mg by mouth daily. 08/04/17  Yes [provider]  benzonatate (TESSALON) 100 MG capsule Take 1 capsule (100 mg  total) by mouth 3 (three) times daily. 01/05/19  Yes Purohit, Salli Quarry, MD  carvedilol (COREG) 3.125 MG tablet Take 3.125 mg by mouth 2 (two) times daily. 02/17/18  Yes [provider]  dextromethorphan-guaiFENesin (MUCINEX DM) 30-600 MG 12hr tablet Take 1 tablet by mouth 2 (two) times daily for 30 days. 01/05/19 02/04/19 Yes Purohit, Salli Quarry, MD  furosemide (LASIX) 20 MG tablet Take 40 mg by mouth daily.   Yes [provider]  mometasone-formoterol (DULERA) 200-5 MCG/ACT AERO Inhale 2 puffs into the lungs 2 (two) times daily for 30 days. 01/05/19  02/04/19 Yes Purohit, Salli Quarry, MD    Family History Family History  Problem Relation Age of Onset   Heart disease Father    Diabetes Brother    Cancer Maternal Grandmother    Hypertension Neg Hx    Stroke Neg Hx     Social History Social History   Tobacco Use   Smoking status: Former Smoker    Types: Cigarettes   Smokeless tobacco: Never Used  Substance Use Topics   Alcohol use: Yes    Comment: "very little"   Drug use: Never     Allergies   Naproxen sodium   Review of Systems Review of Systems  Constitutional: Positive for appetite change and fatigue (chronic). Negative for fever.  HENT: Negative for sore throat.   Eyes: Negative for visual disturbance.  Respiratory: Positive for cough and shortness of breath.   Cardiovascular: Negative for chest pain.  Gastrointestinal: Negative for abdominal pain, diarrhea, nausea and vomiting.  Genitourinary: Negative for difficulty urinating and dysuria.  Musculoskeletal: Positive for back pain. Negative for neck pain and neck stiffness.  Skin: Negative for rash.  Neurological: Positive for numbness. Negative for dizziness, syncope, facial asymmetry, speech difficulty, weakness and headaches.     Physical Exam Updated Vital Signs BP (!) 103/49 (BP Location: Right Wrist)    Pulse 77    Temp 97.6 F (36.4 C) (Oral)    Resp 20    Ht  (1.778 m)    Wt (!) 159.7 kg    SpO2 100%    BMI 50.51 kg/m   Physical Exam Vitals signs and nursing note reviewed.  Constitutional:      General: He is not in acute distress.    Appearance: He is well-developed. He is not diaphoretic.  HENT:     Head: Normocephalic and atraumatic.  Eyes:     Conjunctiva/sclera: Conjunctivae normal.  Neck:     Musculoskeletal: Normal range of motion.  Cardiovascular:     Rate and Rhythm: Normal rate and regular rhythm.     Heart sounds: Normal heart sounds. No murmur. No friction rub. No gallop.   Pulmonary:     Effort: Pulmonary effort is  normal. No respiratory distress.     Breath sounds: Normal breath sounds. No wheezing or rales.     Comments: Right sided chest wall tenderness Abdominal:     General: There is no distension.     Palpations: Abdomen is soft.     Tenderness: There is no abdominal tenderness. There is no guarding.  Musculoskeletal:     Right lower leg: Edema present.     Left lower leg: Edema present.     Comments: Palpable distal pulses 1+, confirmed with doppler signals bilaterally  Skin:    General: Skin is warm and dry.     Capillary Refill: Capillary refill takes less than 2 seconds.  Neurological:     Mental Status:  He is alert and oriented to person, place, and time.     GCS: GCS eye subscore is 4. GCS verbal subscore is 5. GCS motor subscore is 6.     Cranial Nerves: Cranial nerves are intact. No cranial nerve deficit, dysarthria or facial asymmetry.     Sensory: Sensory deficit (reports altered sensation LLE) present.     Motor: No tremor or pronator drift.     Coordination: Coordination normal. Finger-Nose-Finger Test normal.     Gait: Abnormal gait: deferred.     Comments: Reports pain in left hip, can't move it, able to slightly flex and extend left knee. LE strength exam limited.  No pronator drift, equal UE strength      ED Treatments / Results  Labs (all labs ordered are listed, but only abnormal results are displayed) Labs Reviewed  CBC WITH DIFFERENTIAL/PLATELET - Abnormal; Notable for the following components:      Result Value   WBC 24.0 (*)    MCHC 29.5 (*)    Neutro Abs 22.6 (*)    Lymphs Abs 0.5 (*)    All other components within normal limits  COMPREHENSIVE METABOLIC PANEL - Abnormal; Notable for the following components:   Potassium 5.4 (*)    BUN 37 (*)    Creatinine, Ser 2.51 (*)    Calcium 8.5 (*)    Total Protein 5.9 (*)    Albumin 2.8 (*)    AST 209 (*)    Total Bilirubin 2.3 (*)    GFR calc non Af Amer 25 (*)    GFR calc Af Amer 29 (*)    Anion gap 16 (*)     All other components within normal limits  CK - Abnormal; Notable for the following components:   Total CK 10,002 (*)    All other components within normal limits  LACTIC ACID, PLASMA - Abnormal; Notable for the following components:   Lactic Acid, Venous 5.2 (*)    All other components within normal limits  LACTIC ACID, PLASMA - Abnormal; Notable for the following components:   Lactic Acid, Venous 4.1 (*)    All other components within normal limits  BRAIN NATRIURETIC PEPTIDE - Abnormal; Notable for the following components:   B Natriuretic Peptide 128.3 (*)    All other components within normal limits  TROPONIN I - Abnormal; Notable for the following components:   Troponin I 0.07 (*)    All other components within normal limits  LACTIC ACID, PLASMA - Abnormal; Notable for the following components:   Lactic Acid, Venous 4.2 (*)    All other components within normal limits  CULTURE, BLOOD (ROUTINE X 2)  CULTURE, BLOOD (ROUTINE X 2)  MRSA PCR SCREENING  CBC  COMPREHENSIVE METABOLIC PANEL  TROPONIN I  TROPONIN I  TROPONIN I  LACTIC ACID, PLASMA    EKG EKG Interpretation  Date/Time:  Sunday January 14 2019 10:27:58 EDT Ventricular Rate:  83 PR Interval:    QRS Duration: 97 QT Interval:  384 QTC Calculation: 452 R Axis:   -37 Text Interpretation:  Sinus rhythm Left axis deviation Abnormal R-wave progression, early transition Minimal ST depression, lateral leads No previous ECGs available Confirmed by Alvira MondaySchlossman, Markeia Harkless ([id number]) on 01/14/2019 10:39:58 AM   Radiology Dg Chest 2 View  Result Date: 01/14/2019 CLINICAL DATA:  Dyspnea EXAM: CHEST - 2 VIEW COMPARISON:  01/01/2019 chest radiograph. FINDINGS: Low lung volumes. Stable cardiomediastinal silhouette with mild cardiomegaly. No pneumothorax. Patchy opacity at both lung bases. No overt  pulmonary edema. IMPRESSION: Low lung volumes. Patchy bibasilar lung opacities, differential includes aspiration, pneumonia or atelectasis.  Chest radiograph follow-up advised. Electronically Signed   By: Delbert Phenix M.D.   On: 01/14/2019 12:24   Dg Lumbar Spine Complete  Result Date: 01/14/2019 CLINICAL DATA:  Fall last night.  Back and left hip pain. EXAM: LUMBAR SPINE - COMPLETE 4+ VIEW COMPARISON:  Lumbar spine CT 09/18/2008. FINDINGS: There are 5 lumbar type vertebral bodies. The bones are mildly demineralized. No evidence of acute fracture or pars defect. The disc spaces are preserved. Moderate facet degenerative changes are present inferiorly. There are bilateral sacroiliac degenerative changes. There is diffuse aortic atherosclerosis. IMPRESSION: No evidence of acute lumbar spine injury. Facet arthropathy and aortic atherosclerosis noted. Electronically Signed   By: Carey Bullocks M.D.   On: 01/14/2019 12:30   Ct Head Wo Contrast  Result Date: 01/14/2019 CLINICAL DATA:  Head trauma, headache EXAM: CT HEAD WITHOUT CONTRAST CT CERVICAL SPINE WITHOUT CONTRAST TECHNIQUE: Multidetector CT imaging of the head and cervical spine was performed following the standard protocol without intravenous contrast. Multiplanar CT image reconstructions of the cervical spine were also generated. COMPARISON:  None. FINDINGS: CT HEAD FINDINGS Brain: Generalized atrophy without hydrocephalus. Diffuse white matter hypodensity bilaterally appears chronic. Negative for acute infarct, hemorrhage, or mass. No midline shift. Vascular: Negative for hyperdense vessel Skull: Negative Sinuses/Orbits: Mucosal edema paranasal sinuses. Bilateral cataract surgery. Other: None CT CERVICAL SPINE FINDINGS Alignment: Normal alignment Image quality degraded by patient motion. Skull base and vertebrae: Negative for fracture Soft tissues and spinal canal: Atherosclerotic calcification in the carotid artery bilaterally. Negative for soft tissue mass Disc levels: Mild disc and facet degeneration in the cervical spine. Upper chest: Lung apices clear bilaterally. Other: None  IMPRESSION: 1. No acute intracranial abnormality. Atrophy with chronic white matter changes 2. Negative for cervical spine fracture.  Mild degenerative changes. Electronically Signed   By: Marlan Palau M.D.   On: 01/14/2019 14:05   Ct Chest Wo Contrast  Result Date: 01/14/2019 CLINICAL DATA:  Right chest pain since falling last night. History of CHF, COPD and rheumatoid arthritis. EXAM: CT CHEST WITHOUT CONTRAST TECHNIQUE: Multidetector CT imaging of the chest was performed following the standard protocol without IV contrast. COMPARISON:  Chest radiographs today and 01/01/2019. Report only from chest CT 06/10/2005. FINDINGS: Cardiovascular: Moderate atherosclerosis of the aorta, great vessels and coronary arteries. No acute vascular findings on noncontrast imaging. The heart size is normal. There is no pericardial effusion. Mediastinum/Nodes: There are no enlarged mediastinal, hilar or axillary lymph nodes.Hilar assessment is limited by the lack of intravenous contrast. The thyroid gland, trachea and esophagus demonstrate no significant findings. Lungs/Pleura: There is no pleural effusion or pneumothorax. Mild centrilobular emphysema is present. There are patchy airspace opacities at both lung bases which are likely inflammatory. There is no consolidation or suspicious pulmonary nodule. There is a 6 mm perifissural nodule along the right middle lobe on image 69/5 which is likely a lymph node. Upper abdomen: Diffuse contour irregularity of the liver suspicious for cirrhosis. The liver also demonstrates decreased density consistent with steatosis. No focal lesion identified on noncontrast imaging. There are multiple layering calcified gallstones. There is a probable cyst posteriorly in the mid left kidney, incompletely visualized. Musculoskeletal/Chest wall: There are anterior compression deformities at T6 and T7 which are age indeterminate, not previously imaged. The T6 fracture results in greater than 50% loss  vertebral body height, and there may be mild paraspinal hemorrhage at this level suggesting  acuity. IMPRESSION: 1. Patchy bibasilar airspace opacities, consistent with pneumonia, possibly on the basis of aspiration. Followup PA and lateral chest X-ray is recommended in 3-4 weeks following trial of antibiotic therapy to ensure resolution and exclude underlying malignancy. 2. Age-indeterminate compression fractures at T6 and T7. There may be mild paraspinal hemorrhage associated with the T6 fracture, suggesting possible acuity. 3. Aortic Atherosclerosis (ICD10-I70.0) and Emphysema (ICD10-J43.9). 4. Contour irregularity of the liver suspicious for cirrhosis. Cholelithiasis. Electronically Signed   By: Carey BullocksWilliam  Veazey M.D.   On: 01/14/2019 14:14   Ct Cervical Spine Wo Contrast  Result Date: 01/14/2019 CLINICAL DATA:  Head trauma, headache EXAM: CT HEAD WITHOUT CONTRAST CT CERVICAL SPINE WITHOUT CONTRAST TECHNIQUE: Multidetector CT imaging of the head and cervical spine was performed following the standard protocol without intravenous contrast. Multiplanar CT image reconstructions of the cervical spine were also generated. COMPARISON:  None. FINDINGS: CT HEAD FINDINGS Brain: Generalized atrophy without hydrocephalus. Diffuse white matter hypodensity bilaterally appears chronic. Negative for acute infarct, hemorrhage, or mass. No midline shift. Vascular: Negative for hyperdense vessel Skull: Negative Sinuses/Orbits: Mucosal edema paranasal sinuses. Bilateral cataract surgery. Other: None CT CERVICAL SPINE FINDINGS Alignment: Normal alignment Image quality degraded by patient motion. Skull base and vertebrae: Negative for fracture Soft tissues and spinal canal: Atherosclerotic calcification in the carotid artery bilaterally. Negative for soft tissue mass Disc levels: Mild disc and facet degeneration in the cervical spine. Upper chest: Lung apices clear bilaterally. Other: None IMPRESSION: 1. No acute intracranial  abnormality. Atrophy with chronic white matter changes 2. Negative for cervical spine fracture.  Mild degenerative changes. Electronically Signed   By: Marlan Palauharles  Clark M.D.   On: 01/14/2019 14:05   Dg Hip Unilat W Or Wo Pelvis 2-3 Views Left  Result Date: 01/14/2019 CLINICAL DATA:  Fall last night.  Back and left hip pain. EXAM: DG HIP (WITH OR WITHOUT PELVIS) 2-3V LEFT COMPARISON:  None. FINDINGS: Examination is limited by body habitus and positioning. The bones appear demineralized. No definite evidence of acute fracture or dislocation. However, the left femoral neck is suboptimally visualized. Mild degenerative changes of the hips, sacroiliac joints and lower lumbar spine. IMPRESSION: No definite acute osseous findings. Evaluation of the left hip is limited by body habitus and positioning. Consider CT for more definitive evaluation. Electronically Signed   By: Carey BullocksWilliam  Veazey M.D.   On: 01/14/2019 12:28    Procedures .Critical Care Performed by: Alvira MondaySchlossman, Samanthia Howland, MD Authorized by: Alvira MondaySchlossman, Yailin Biederman, MD   Critical care provider statement:    Critical care time (minutes):  45   Critical care was time spent personally by me on the following activities:  Discussions with consultants, evaluation of patient's response to treatment, examination of patient, ordering and performing treatments and interventions, ordering and review of laboratory studies, ordering and review of radiographic studies, pulse oximetry, re-evaluation of patient's condition, obtaining history from patient or surrogate and review of old charts   (including critical care time)  Medications Ordered in ED Medications  vancomycin variable dose per unstable renal function (pharmacist dosing) (has no administration in time range)  carvedilol (COREG) tablet 3.125 mg (has no administration in time range)  mometasone-formoterol (DULERA) 200-5 MCG/ACT inhaler 2 puff (2 puffs Inhalation Not Given 01/14/19 1908)  heparin injection 5,000  Units (5,000 Units Subcutaneous Given 01/14/19 1851)  sodium chloride flush (NS) 0.9 % injection 3 mL (has no administration in time range)  0.9 %  sodium chloride infusion ( Intravenous New Bag/Given 01/14/19 1849)  traMADol (ULTRAM) tablet  50 mg (50 mg Oral Given 01/14/19 1851)  ondansetron (ZOFRAN) tablet 4 mg (has no administration in time range)    Or  ondansetron (ZOFRAN) injection 4 mg (has no administration in time range)  ceFEPIme (MAXIPIME) 2 g in sodium chloride 0.9 % 100 mL IVPB (2 g Intravenous New Bag/Given 01/14/19 1811)  0.9 %  sodium chloride infusion (250 mLs Intravenous New Bag/Given 01/14/19 1808)  albuterol (PROVENTIL) (2.5 MG/3ML) 0.083% nebulizer solution 2.5 mg (has no administration in time range)  sodium chloride 0.9 % bolus 1,000 mL (1,000 mLs Intravenous New Bag/Given 01/14/19 1618)  sodium chloride 0.9 % bolus 1,000 mL (0 mLs Intravenous Stopped 01/14/19 1618)  piperacillin-tazobactam (ZOSYN) IVPB 3.375 g (0 g Intravenous Stopped 01/14/19 1510)  vancomycin (VANCOCIN) 2,000 mg in sodium chloride 0.9 % 500 mL IVPB (0 mg Intravenous Stopped 01/14/19 1737)     Initial Impression / Assessment and Plan / ED Course  I have reviewed the triage vital signs and the nursing notes.  Pertinent labs & imaging results that were available during my care of the patient were reviewed by me and considered in my medical decision making (see chart for details).         73yo male with recent admission 4/3 with suspected COPD exacerbation, negative COVID19 testing, with history of COPD, CHF, chronic respiratory failure on 2L O2, htn, RA, who presents with concern for fall last night with inability to get up after fall, continuing right sided chest pain, shortness of breath and cough.  Regarding cough, dyspnea and right sided chest pain. DDx includes pneumonia, COPD exacerbation, CHF, PE, rib fx, ACS, anemia.  Troponin elevated to .07, likely in the setting of acute stress but will  continue to monitor. CBC without significant anemia. Unable to complete CT PE study given renal function, however CT noncontrast shows concern for bibasilar pneumonia. No rib fx.  Chest wall tenderness consistent with likely msk chest pain related to pneumonia/coughing.  Doubt dissection in this setting and given duration of CP.  Suspicion for cirrhosis on CT.   Regarding leg numbness, ddx includes central etiology including CVA, ICH from trauma, spinal or peripheral nerve problem given compression/fall/pressure on area over night.  No numbness or weakness other than left leg, overall low suspicion for CVA, and in setting of fall suspect more likely lower CNS  CT head and CSpine show no acute abnormalities. Concern for T spine fx.   XR hip shows no acute abnormalities but is limited.   Labs significant for AKI with CK greater than 10000 and suspect AKI secondary to rhabdomyolysis. Lactate elevated.  BP stable. Given IV fluid/vanc/zosyn.  Ordered MR T/L and hip to evaluate left lower extremity numbness/weakness/pain.    Will admit for continued evaluation of rhabdomyolysis, pneumonia, left lower ext pain and numbness/weakness.      Final Clinical Impressions(s) / ED Diagnoses   Final diagnoses:  Right-sided chest pain  Traumatic rhabdomyolysis, initial encounter (HCC)  Acute kidney injury (HCC)  Pneumonia of both lower lobes due to infectious organism (HCC)  Numbness and tingling of left leg  Fall, initial encounter  Lactic acidosis  Leukocytosis, unspecified type  Elevated troponin    ED Discharge Orders    None       Alvira Monday, MD 01/14/19 2002

## 2019-01-14 NOTE — H&P (Addendum)
History and Physical    William BarreRobert L Yeatman JYN:[id number] DOB: [birth year] DOA: 01/14/2019  PCP: Patient, No Pcp Per  Patient coming from: Home   Chief Complaint: Fall  HPI: William Peterson is a 73 y.o. male with medical history significant of COPD with chronic hypoxemic respiratory failure requiring 2 L nasal cannula at baseline, hypertension, chronic diastolic heart failure, who was recently admitted at [facility] from 3/30-4/3 for COPD exacerbation and COVID 19 rule out.  Testing was negative and patient was discharged home in stable condition.  He now returns after a fall at home.  According to report, he had a fall around 10 PM last night, fell backwards onto the toilet and was unable to get up.  He also complains of right-sided chest pain that is sharp and worsens with palpation as well as pain in his right shoulder blade.  He also admits to left foot numbness as well as worsening peripheral edema bilaterally.   ED Course: Labs obtained which showed acute kidney injury with creatinine 2.51, elevated liver enzyme AST 209, BNP 128, CK 10,000, troponin 0.07, lactic acid 5.2 trended down to 4.1, WBC 24.  Imaging revealed patchy bibasilar airspace opacities consistent with pneumonia, age indeterminant compression fracture at T6, T7.   Review of Systems: As per HPI otherwise 10 point review of systems negative.   Past Medical History:  Diagnosis Date  . CHF (congestive heart failure) (HCC)   . COPD (chronic obstructive pulmonary disease) (HCC)    2L O2 at baseline  . HTN (hypertension)   . RA (rheumatoid arthritis) (HCC)    Not on immunosuppresants currently it looks like    Past Surgical History:  Procedure Laterality Date  . LEFT HEART CATH       reports that he has quit smoking. His smoking use included cigarettes. He has never used smokeless tobacco. He reports current alcohol use. He reports that he does not use drugs.  Allergies  Allergen Reactions  . Naproxen  Sodium Rash    Family History  Problem Relation Age of Onset  . Heart disease Father   . Diabetes Brother   . Cancer Maternal Grandmother   . Hypertension Neg Hx   . Stroke Neg Hx     Prior to Admission medications   Medication Sig Start Date End Date Taking? Authorizing Provider  albuterol (PROVENTIL HFA;VENTOLIN HFA) 108 (90 Base) MCG/ACT inhaler Inhale 2 puffs into the lungs every 6 (six) hours as needed for wheezing or shortness of breath.    [provider]  albuterol (PROVENTIL) (2.5 MG/3ML) 0.083% nebulizer solution Take 2.5 mg by nebulization every 6 (six) hours as needed for wheezing or shortness of breath.    [provider]  atorvastatin (LIPITOR) 10 MG tablet Take 10 mg by mouth daily. 08/04/17   [provider]  benzonatate (TESSALON) 100 MG capsule Take 1 capsule (100 mg total) by mouth 3 (three) times daily. 01/05/19   Purohit, Salli QuarryShrey C, MD  carvedilol (COREG) 3.125 MG tablet Take 3.125 mg by mouth 2 (two) times daily. 02/17/18   [provider]  dextromethorphan-guaiFENesin (MUCINEX DM) 30-600 MG 12hr tablet Take 1 tablet by mouth 2 (two) times daily for 30 days. 01/05/19 02/04/19  Purohit, Salli QuarryShrey C, MD  furosemide (LASIX) 20 MG tablet Take 40 mg by mouth daily.    [provider]  mometasone-formoterol (DULERA) 200-5 MCG/ACT AERO Inhale 2 puffs into the lungs 2 (two) times daily for 30 days. 01/05/19 02/04/19  Purohit,  Salli Quarry, MD    Physical Exam: Vitals:   01/14/19 1300 01/14/19 1315 01/14/19 1402 01/14/19 1415  BP: 103/84 (!) 124/96 (!) 110/53 114/74  Pulse: 79 79 80 82  Resp: (!) 24 19 (!) 24 (!) 23  Temp:      TempSrc:      SpO2: 100% 99% 100% 100%  Weight:      Height:        Constitutional: NAD, calm, comfortable Eyes: PERRL, lids and conjunctivae normal ENMT: Mucous membranes are dry Neck: normal, supple, no masses, no thyromegaly Respiratory: clear to auscultation bilaterally, no wheezing, no crackles. Normal  respiratory effort. No accessory muscle use.  On 2 L nasal cannula O2 Cardiovascular: Regular rate and rhythm, no murmurs / rubs / gallops. + Pitting bilateral peripheral edema  Abdomen: no tenderness, no masses palpated. No hepatosplenomegaly. Bowel sounds positive.  Obese Musculoskeletal: No joint deformity upper and lower extremities. Good ROM, no contractures. Normal muscle tone.  Skin: + Bilateral feet appear dusky, cool to touch Neurologic: CN 2-12 grossly intact.  Left foot numbness, moves all 4 extremities, diminished strength of the left foot. +2 patellar reflexes bilaterally  Psychiatric: Normal judgment and insight. Alert and oriented x 3. Normal mood.   Labs on Admission: I have personally reviewed following labs and imaging studies  CBC: Recent Labs  Lab 01/14/19 1058  WBC 24.0*  NEUTROABS 22.6*  HGB 13.4  HCT 45.4  MCV 95.8  PLT 291   Basic Metabolic Panel: Recent Labs  Lab 01/14/19 1058  NA 141  K 5.4*  CL 102  CO2 23  GLUCOSE 85  BUN 37*  CREATININE 2.51*  CALCIUM 8.5*   GFR: Estimated Creatinine Clearance: 40.4 mL/min (A) (by C-G formula based on SCr of 2.51 mg/dL (H)). Liver Function Tests: Recent Labs  Lab 01/14/19 1058  AST 209*  ALT 38  ALKPHOS 59  BILITOT 2.3*  PROT 5.9*  ALBUMIN 2.8*   No results for input(s): LIPASE, AMYLASE in the last 168 hours. No results for input(s): AMMONIA in the last 168 hours. Coagulation Profile: No results for input(s): INR, PROTIME in the last 168 hours. Cardiac Enzymes: Recent Labs  Lab 01/14/19 1058  CKTOTAL 10,002*  TROPONINI 0.07*   BNP (last 3 results) No results for input(s): PROBNP in the last 8760 hours. HbA1C: No results for input(s): HGBA1C in the last 72 hours. CBG: No results for input(s): GLUCAP in the last 168 hours. Lipid Profile: No results for input(s): CHOL, HDL, LDLCALC, TRIG, CHOLHDL, LDLDIRECT in the last 72 hours. Thyroid Function Tests: No results for input(s): TSH, T4TOTAL,  FREET4, T3FREE, THYROIDAB in the last 72 hours. Anemia Panel: No results for input(s): VITAMINB12, FOLATE, FERRITIN, TIBC, IRON, RETICCTPCT in the last 72 hours. Urine analysis: No results found for: COLORURINE, APPEARANCEUR, LABSPEC, PHURINE, GLUCOSEU, HGBUR, BILIRUBINUR, KETONESUR, PROTEINUR, UROBILINOGEN, NITRITE, LEUKOCYTESUR Sepsis Labs: !!!!!!!!!!!!!!!!!!!!!!!!!!!!!!!!!!!!!!!!!!!! (procalcitonin:4,lacticidven:4) )No results found for this or any previous visit (from the past 240 hour(s)).   Radiological Exams on Admission: Dg Chest 2 View  Result Date: 01/14/2019 CLINICAL DATA:  Dyspnea EXAM: CHEST - 2 VIEW COMPARISON:  01/01/2019 chest radiograph. FINDINGS: Low lung volumes. Stable cardiomediastinal silhouette with mild cardiomegaly. No pneumothorax. Patchy opacity at both lung bases. No overt pulmonary edema. IMPRESSION: Low lung volumes. Patchy bibasilar lung opacities, differential includes aspiration, pneumonia or atelectasis. Chest radiograph follow-up advised. Electronically Signed   By: Delbert Phenix M.D.   On: 01/14/2019 12:24   Dg Lumbar Spine Complete  Result Date:  01/14/2019 CLINICAL DATA:  Fall last night.  Back and left hip pain. EXAM: LUMBAR SPINE - COMPLETE 4+ VIEW COMPARISON:  Lumbar spine CT 09/18/2008. FINDINGS: There are 5 lumbar type vertebral bodies. The bones are mildly demineralized. No evidence of acute fracture or pars defect. The disc spaces are preserved. Moderate facet degenerative changes are present inferiorly. There are bilateral sacroiliac degenerative changes. There is diffuse aortic atherosclerosis. IMPRESSION: No evidence of acute lumbar spine injury. Facet arthropathy and aortic atherosclerosis noted. Electronically Signed   By: Carey Bullocks M.D.   On: 01/14/2019 12:30   Ct Head Wo Contrast  Result Date: 01/14/2019 CLINICAL DATA:  Head trauma, headache EXAM: CT HEAD WITHOUT CONTRAST CT CERVICAL SPINE WITHOUT CONTRAST TECHNIQUE: Multidetector  CT imaging of the head and cervical spine was performed following the standard protocol without intravenous contrast. Multiplanar CT image reconstructions of the cervical spine were also generated. COMPARISON:  None. FINDINGS: CT HEAD FINDINGS Brain: Generalized atrophy without hydrocephalus. Diffuse white matter hypodensity bilaterally appears chronic. Negative for acute infarct, hemorrhage, or mass. No midline shift. Vascular: Negative for hyperdense vessel Skull: Negative Sinuses/Orbits: Mucosal edema paranasal sinuses. Bilateral cataract surgery. Other: None CT CERVICAL SPINE FINDINGS Alignment: Normal alignment Image quality degraded by patient motion. Skull base and vertebrae: Negative for fracture Soft tissues and spinal canal: Atherosclerotic calcification in the carotid artery bilaterally. Negative for soft tissue mass Disc levels: Mild disc and facet degeneration in the cervical spine. Upper chest: Lung apices clear bilaterally. Other: None IMPRESSION: 1. No acute intracranial abnormality. Atrophy with chronic white matter changes 2. Negative for cervical spine fracture.  Mild degenerative changes. Electronically Signed   By: Marlan Palau M.D.   On: 01/14/2019 14:05   Ct Chest Wo Contrast  Result Date: 01/14/2019 CLINICAL DATA:  Right chest pain since falling last night. History of CHF, COPD and rheumatoid arthritis. EXAM: CT CHEST WITHOUT CONTRAST TECHNIQUE: Multidetector CT imaging of the chest was performed following the standard protocol without IV contrast. COMPARISON:  Chest radiographs today and 01/01/2019. Report only from chest CT 06/10/2005. FINDINGS: Cardiovascular: Moderate atherosclerosis of the aorta, great vessels and coronary arteries. No acute vascular findings on noncontrast imaging. The heart size is normal. There is no pericardial effusion. Mediastinum/Nodes: There are no enlarged mediastinal, hilar or axillary lymph nodes.Hilar assessment is limited by the lack of intravenous  contrast. The thyroid gland, trachea and esophagus demonstrate no significant findings. Lungs/Pleura: There is no pleural effusion or pneumothorax. Mild centrilobular emphysema is present. There are patchy airspace opacities at both lung bases which are likely inflammatory. There is no consolidation or suspicious pulmonary nodule. There is a 6 mm perifissural nodule along the right middle lobe on image 69/5 which is likely a lymph node. Upper abdomen: Diffuse contour irregularity of the liver suspicious for cirrhosis. The liver also demonstrates decreased density consistent with steatosis. No focal lesion identified on noncontrast imaging. There are multiple layering calcified gallstones. There is a probable cyst posteriorly in the mid left kidney, incompletely visualized. Musculoskeletal/Chest wall: There are anterior compression deformities at T6 and T7 which are age indeterminate, not previously imaged. The T6 fracture results in greater than 50% loss vertebral body height, and there may be mild paraspinal hemorrhage at this level suggesting acuity. IMPRESSION: 1. Patchy bibasilar airspace opacities, consistent with pneumonia, possibly on the basis of aspiration. Followup PA and lateral chest X-ray is recommended in 3-4 weeks following trial of antibiotic therapy to ensure resolution and exclude underlying malignancy. 2. Age-indeterminate compression fractures at  T6 and T7. There may be mild paraspinal hemorrhage associated with the T6 fracture, suggesting possible acuity. 3. Aortic Atherosclerosis (ICD10-I70.0) and Emphysema (ICD10-J43.9). 4. Contour irregularity of the liver suspicious for cirrhosis. Cholelithiasis. Electronically Signed   By: Carey BullocksWilliam  Veazey M.D.   On: 01/14/2019 14:14   Ct Cervical Spine Wo Contrast  Result Date: 01/14/2019 CLINICAL DATA:  Head trauma, headache EXAM: CT HEAD WITHOUT CONTRAST CT CERVICAL SPINE WITHOUT CONTRAST TECHNIQUE: Multidetector CT imaging of the head and cervical  spine was performed following the standard protocol without intravenous contrast. Multiplanar CT image reconstructions of the cervical spine were also generated. COMPARISON:  None. FINDINGS: CT HEAD FINDINGS Brain: Generalized atrophy without hydrocephalus. Diffuse white matter hypodensity bilaterally appears chronic. Negative for acute infarct, hemorrhage, or mass. No midline shift. Vascular: Negative for hyperdense vessel Skull: Negative Sinuses/Orbits: Mucosal edema paranasal sinuses. Bilateral cataract surgery. Other: None CT CERVICAL SPINE FINDINGS Alignment: Normal alignment Image quality degraded by patient motion. Skull base and vertebrae: Negative for fracture Soft tissues and spinal canal: Atherosclerotic calcification in the carotid artery bilaterally. Negative for soft tissue mass Disc levels: Mild disc and facet degeneration in the cervical spine. Upper chest: Lung apices clear bilaterally. Other: None IMPRESSION: 1. No acute intracranial abnormality. Atrophy with chronic white matter changes 2. Negative for cervical spine fracture.  Mild degenerative changes. Electronically Signed   By: Marlan Palauharles  Clark M.D.   On: 01/14/2019 14:05   Dg Hip Unilat W Or Wo Pelvis 2-3 Views Left  Result Date: 01/14/2019 CLINICAL DATA:  Fall last night.  Back and left hip pain. EXAM: DG HIP (WITH OR WITHOUT PELVIS) 2-3V LEFT COMPARISON:  None. FINDINGS: Examination is limited by body habitus and positioning. The bones appear demineralized. No definite evidence of acute fracture or dislocation. However, the left femoral neck is suboptimally visualized. Mild degenerative changes of the hips, sacroiliac joints and lower lumbar spine. IMPRESSION: No definite acute osseous findings. Evaluation of the left hip is limited by body habitus and positioning. Consider CT for more definitive evaluation. Electronically Signed   By: Carey BullocksWilliam  Veazey M.D.   On: 01/14/2019 12:28    EKG: Independently reviewed.  Normal sinus rhythm,  rate 83, left axis deviation, poor R wave progression  Assessment/Plan Principal Problem:   Fall Active Problems:   Chronic diastolic CHF (congestive heart failure) (HCC)   Chronic respiratory failure with hypoxia (HCC)   HCAP (healthcare-associated pneumonia)   COPD (chronic obstructive pulmonary disease) (HCC)   Rhabdomyolysis   Elevated troponin   AKI (acute kidney injury) (HCC)   HLD (hyperlipidemia)  Fall at home -CT head and neck negative for acute intracranial abnormality, negative for cervical spine fracture -Lumbar x-ray negative for acute lumbar spine injury -Age indeterminate compression fracture T6, T7 with mild paraspinal hemorrhage associated with T6 -MRI thoracic, lumbar spine and left hip have been ordered in the emergency department and pending at time of admission -PT OT pending MRI result  HCAP, doubt sepsis -CT chest without contrast revealed patchy bibasilar airspace opacities -Lactic acid could be elevated secondary to rhabdomyolysis -Leukocytosis can be explained due to his recent steroid prescription for COPD exacerbation -Patient is afebrile, continue to trend fever curve and WBC -Blood cultures pending -Check MRSA PCR -Patient given Zosyn in the emergency room.  Continue vanco/Cefepime  Chronic hypoxemic respiratory failure with COPD -Requires 2 L nasal cannula at baseline -Without current exacerbation at this time   Rhabdomyolysis -IV fluid -Trend CK  Elevated troponin -Trend troponin, patient without any cardiac  typical chest pain.  He has right-sided chest pain that is reproducible with palpation and is sharp in nature.  EKG not suggestive of ACS  Acute kidney injury -Baseline creatinine 0.8 -IV fluids, trend BMP  Hyperlipidemia -Hold atorvastatin until LFT can be rechecked  Chronic diastolic heart failure -BNP 128 -Patient does appear fluid overloaded but has significant AKI and rhabdo. Will elect to give IVF for now, hold lasix. Obtain  echocardiogram -Continue carvedilol  Suspected bilateral peripheral vascular disease -Obtain ABI  Morbid obesity -Body mass index is 48.93 kg/m.   DVT prophylaxis: Subcutaneous heparin Code Status: Full code Family Communication: Spoke with neighbor and gave update at patient's request  Disposition Plan: Pending further work-up, improvement in his lab work Consults called: None Admission status: Inpatient  Severity of Illness: The appropriate patient status for this patient is INPATIENT. Inpatient status is judged to be reasonable and necessary in order to provide the required intensity of service to ensure the patient's safety. The patient's presenting symptoms, physical exam findings, and initial radiographic and laboratory data in the context of their chronic comorbidities is felt to place them at high risk for further clinical deterioration. Furthermore, it is not anticipated that the patient will be medically stable for discharge from the hospital within 2 midnights of admission. The following factors support the patient status of inpatient.   " The patient's presenting symptoms include fall, right-sided chest pain. " The worrisome physical exam findings include right-sided chest pain reproducible to palpation, left foot numbness and decreased strength. " The initial radiographic and laboratory data are worrisome because of possible acute compression fracture T6, T7, elevated WBC, elevated lactic acid, elevated troponin, elevated CK, elevated creatinine. " The chronic co-morbidities include morbid obesity, chronic diastolic heart failure, hyperlipidemia, chronic hypoxemic respiratory failure, COPD.   * I certify that at the point of admission it is my clinical judgment that the patient will require inpatient hospital care spanning beyond 2 midnights from the point of admission due to high intensity of service, high risk for further deterioration and high frequency of surveillance  required.Noralee Stain, DO [facility] 01/14/2019, 3:36 PM    How to contact the [facility] Attending or Consulting provider 7A - 7P or covering provider during after hours 7P -7A, for this patient?  1. Check the care team in [facility] and look for a) attending/consulting [facility] provider listed and b) the [facility] team listed 2. Log into [email or web address] and use [facility] universal password to access. If you do not have the password, please contact the hospital operator. 3. Locate the [facility] provider you are looking for under [facility] and page to a number that you can be directly reached. 4. If you still have difficulty reaching the provider, please page the [facility] (Director on Call) for the Hospitalists listed on amion for assistance.

## 2019-01-14 NOTE — ED Notes (Signed)
ED TO INPATIENT HANDOFF REPORT  ED Nurse Name and Phone #:  Ladona Ridgel [phone]  S Name/Age/Gender William Peterson 73 y.o. male Room/Bed: 031C/031C  Code Status   Code Status: Prior  Home/SNF/Other Home Patient oriented to: self, place, time and situation Is this baseline? Yes   Triage Complete: Triage complete  Chief Complaint chest pain  Triage Note Pt from home via ems; pt has mechanical fall around 2200 last night, fell backwards onto toilet, unable to get up; unknown loc; c/o R sided cp that began last night, sharp, 10/10; no increased pain w/ palpation; denies sob, dizziness; hx copd, recently diagnosed with pneumonia, tested for covid-19, came back negative; all 4 extremities cyanotic and cool to touch; pt wears 2L at home; c/o L hip pain, some rotation, little to no feeling in L foot, unable to wiggle toes; per ems, describes as found in an "upward fetal position", feet very cyanotic on ems arrival, has regained some color; abrasions to L arm, productive cough present x 2 weeks; 324 asa and 1200 NS given PTA   104/62 HR 90 86% 15L w/ ems;  100% 2L at present capnography 30 CBG 130   Allergies Allergies  Allergen Reactions  . Naproxen Sodium Rash    Level of Care/Admitting Diagnosis ED Disposition    ED Disposition Condition Comment   Admit  Hospital Area: [facility] [[id number]]  Level of Care: Telemetry Medical [104]  Diagnosis: Fall [[id number]]  Admitting Physician: Noralee Stain [[id number]]  Attending Physician: Noralee Stain [phone]  Estimated length of stay: 3 - 4 days  Certification:: I certify this patient will need inpatient services for at least 2 midnights  PT Class (Do Not Modify): Inpatient [101]  PT Acc Code (Do Not Modify): Private [1]       B Medical/Surgery History Past Medical History:  Diagnosis Date  . CHF (congestive heart failure) (HCC)   . COPD (chronic obstructive pulmonary disease) (HCC)    2L O2 at baseline  .  HTN (hypertension)   . RA (rheumatoid arthritis) (HCC)    Not on immunosuppresants currently it looks like   Past Surgical History:  Procedure Laterality Date  . LEFT HEART CATH       A IV Location/Drains/Wounds Patient Lines/Drains/Airways Status   Active Line/Drains/Airways    Name:   Placement date:   Placement time:   Site:   Days:   Peripheral IV 01/14/19 Right Antecubital   01/14/19    -    Antecubital   less than 1          Intake/Output Last 24 hours  Intake/Output Summary (Last 24 hours) at 01/14/2019 1619 Last data filed at 01/14/2019 1510 Gross per 24 hour  Intake 50 ml  Output -  Net 50 ml    Labs/Imaging Results for orders placed or performed during the hospital encounter of 01/14/19 (from the past 48 hour(s))  CBC with Differential     Status: Abnormal   Collection Time: 01/14/19 10:58 AM  Result Value Ref Range   WBC 24.0 (H) 4.0 - 10.5 K/uL   RBC 4.74 4.22 - 5.81 MIL/uL   Hemoglobin 13.4 13.0 - 17.0 g/dL   HCT 97.5 30.0 - 51.1 %   MCV 95.8 80.0 - 100.0 fL   MCH 28.3 26.0 - 34.0 pg   MCHC 29.5 (L) 30.0 - 36.0 g/dL   RDW 02.1 11.7 - 35.6 %   Platelets 291 150 - 400 K/uL   nRBC 0.0 0.0 -  0.2 %   Neutrophils Relative % 94 %   Neutro Abs 22.6 (H) 1.7 - 7.7 K/uL   Lymphocytes Relative 2 %   Lymphs Abs 0.5 (L) 0.7 - 4.0 K/uL   Monocytes Relative 4 %   Monocytes Absolute 1.0 0.1 - 1.0 K/uL   Eosinophils Relative 0 %   Eosinophils Absolute 0.0 0.0 - 0.5 K/uL   Basophils Relative 0 %   Basophils Absolute 0.0 0.0 - 0.1 K/uL   nRBC 0 0 /100 WBC   Abs Immature Granulocytes 0.00 0.00 - 0.07 K/uL    Comment: Performed at [facility], [address] [place], [place] [address]  Comprehensive metabolic panel     Status: Abnormal   Collection Time: 01/14/19 10:58 AM  Result Value Ref Range   Sodium 141 135 - 145 mmol/L   Potassium 5.4 (H) 3.5 - 5.1 mmol/L   Chloride 102 98 - 111 mmol/L   CO2 23 22 - 32 mmol/L   Glucose, Bld 85 70 - 99 mg/dL   BUN  37 (H) 8 - 23 mg/dL   Creatinine, Ser 1.912.51 (H) 0.61 - 1.24 mg/dL   Calcium 8.5 (L) 8.9 - 10.3 mg/dL   Total Protein 5.9 (L) 6.5 - 8.1 g/dL   Albumin 2.8 (L) 3.5 - 5.0 g/dL   AST 478209 (H) 15 - 41 U/L   ALT 38 0 - 44 U/L   Alkaline Phosphatase 59 38 - 126 U/L   Total Bilirubin 2.3 (H) 0.3 - 1.2 mg/dL   GFR calc non Af Amer 25 (L) >60 mL/min   GFR calc Af Amer 29 (L) >60 mL/min   Anion gap 16 (H) 5 - 15    Comment: Performed at [facility], [address] [place], [place] [address]  CK     Status: Abnormal   Collection Time: 01/14/19 10:58 AM  Result Value Ref Range   Total CK 10,002 (H) 49 - 397 U/L    Comment: RESULTS CONFIRMED BY MANUAL DILUTION Performed at [facility], [address] [place], [place] [address]   Troponin I - ONCE - STAT     Status: Abnormal   Collection Time: 01/14/19 10:58 AM  Result Value Ref Range   Troponin I 0.07 (HH) <0.03 ng/mL    Comment: CRITICAL RESULT CALLED TO, READ BACK BY AND VERIFIED WITH: H.VANCRUTMORE,RN @ 1232 01/14/2019 WEBBERJ Performed at [facility], [address] [place], [place] [address]   Brain natriuretic peptide     Status: Abnormal   Collection Time: 01/14/19 10:59 AM  Result Value Ref Range   B Natriuretic Peptide 128.3 (H) 0.0 - 100.0 pg/mL    Comment: Performed at [facility], [address] [place], [place] [address]  Lactic acid, plasma     Status: Abnormal   Collection Time: 01/14/19 11:15 AM  Result Value Ref Range   Lactic Acid, Venous 5.2 (HH) 0.5 - 1.9 mmol/L    Comment: CRITICAL RESULT CALLED TO, READ BACK BY AND VERIFIED WITH: Hilbert OdorVANKRETSTHNER, H RN @ 1151 ON 01/14/2019 BY TEMOCHE, H Performed at [facility], [address] [place], [place] [address]   Lactic acid, plasma     Status: Abnormal   Collection Time: 01/14/19  1:00 PM  Result Value Ref Range   Lactic Acid, Venous 4.1 (HH) 0.5 - 1.9 mmol/L    Comment: CRITICAL RESULT CALLED TO, READ BACK BY AND VERIFIED  WITH: H.VANKRETSCHMAR,RN @  1402 01/14/2019 WEBBERJ Performed at [facility], [address], [place], [place] [address]    Dg Chest 2 View  Result Date: 01/14/2019 CLINICAL DATA:  Dyspnea EXAM: CHEST - 2 VIEW COMPARISON:  01/01/2019 chest radiograph. FINDINGS: Low lung volumes. Stable cardiomediastinal silhouette with mild cardiomegaly. No pneumothorax. Patchy opacity at both lung bases. No overt pulmonary edema. IMPRESSION: Low lung volumes. Patchy bibasilar lung opacities, differential includes aspiration, pneumonia or atelectasis. Chest radiograph follow-up advised. Electronically Signed   By: Delbert Phenix M.D.   On: 01/14/2019 12:24   Dg Lumbar Spine Complete  Result Date: 01/14/2019 CLINICAL DATA:  Fall last night.  Back and left hip pain. EXAM: LUMBAR SPINE - COMPLETE 4+ VIEW COMPARISON:  Lumbar spine CT 09/18/2008. FINDINGS: There are 5 lumbar type vertebral bodies. The bones are mildly demineralized. No evidence of acute fracture or pars defect. The disc spaces are preserved. Moderate facet degenerative changes are present inferiorly. There are bilateral sacroiliac degenerative changes. There is diffuse aortic atherosclerosis. IMPRESSION: No evidence of acute lumbar spine injury. Facet arthropathy and aortic atherosclerosis noted. Electronically Signed   By: Carey Bullocks M.D.   On: 01/14/2019 12:30   Ct Head Wo Contrast  Result Date: 01/14/2019 CLINICAL DATA:  Head trauma, headache EXAM: CT HEAD WITHOUT CONTRAST CT CERVICAL SPINE WITHOUT CONTRAST TECHNIQUE: Multidetector CT imaging of the head and cervical spine was performed following the standard protocol without intravenous contrast. Multiplanar CT image reconstructions of the cervical spine were also generated. COMPARISON:  None. FINDINGS: CT HEAD FINDINGS Brain: Generalized atrophy without hydrocephalus. Diffuse white matter hypodensity bilaterally appears chronic. Negative for acute infarct, hemorrhage, or mass. No midline  shift. Vascular: Negative for hyperdense vessel Skull: Negative Sinuses/Orbits: Mucosal edema paranasal sinuses. Bilateral cataract surgery. Other: None CT CERVICAL SPINE FINDINGS Alignment: Normal alignment Image quality degraded by patient motion. Skull base and vertebrae: Negative for fracture Soft tissues and spinal canal: Atherosclerotic calcification in the carotid artery bilaterally. Negative for soft tissue mass Disc levels: Mild disc and facet degeneration in the cervical spine. Upper chest: Lung apices clear bilaterally. Other: None IMPRESSION: 1. No acute intracranial abnormality. Atrophy with chronic white matter changes 2. Negative for cervical spine fracture.  Mild degenerative changes. Electronically Signed   By: Marlan Palau M.D.   On: 01/14/2019 14:05   Ct Chest Wo Contrast  Result Date: 01/14/2019 CLINICAL DATA:  Right chest pain since falling last night. History of CHF, COPD and rheumatoid arthritis. EXAM: CT CHEST WITHOUT CONTRAST TECHNIQUE: Multidetector CT imaging of the chest was performed following the standard protocol without IV contrast. COMPARISON:  Chest radiographs today and 01/01/2019. Report only from chest CT 06/10/2005. FINDINGS: Cardiovascular: Moderate atherosclerosis of the aorta, great vessels and coronary arteries. No acute vascular findings on noncontrast imaging. The heart size is normal. There is no pericardial effusion. Mediastinum/Nodes: There are no enlarged mediastinal, hilar or axillary lymph nodes.Hilar assessment is limited by the lack of intravenous contrast. The thyroid gland, trachea and esophagus demonstrate no significant findings. Lungs/Pleura: There is no pleural effusion or pneumothorax. Mild centrilobular emphysema is present. There are patchy airspace opacities at both lung bases which are likely inflammatory. There is no consolidation or suspicious pulmonary nodule. There is a 6 mm perifissural nodule along the right middle lobe on image 69/5 which is  likely a lymph node. Upper abdomen: Diffuse contour irregularity of the liver suspicious for cirrhosis. The liver also demonstrates decreased density consistent with steatosis. No focal lesion identified on noncontrast imaging.  There are multiple layering calcified gallstones. There is a probable cyst posteriorly in the mid left kidney, incompletely visualized. Musculoskeletal/Chest wall: There are anterior compression deformities at T6 and T7 which are age indeterminate, not previously imaged. The T6 fracture results in greater than 50% loss vertebral body height, and there may be mild paraspinal hemorrhage at this level suggesting acuity. IMPRESSION: 1. Patchy bibasilar airspace opacities, consistent with pneumonia, possibly on the basis of aspiration. Followup PA and lateral chest X-ray is recommended in 3-4 weeks following trial of antibiotic therapy to ensure resolution and exclude underlying malignancy. 2. Age-indeterminate compression fractures at T6 and T7. There may be mild paraspinal hemorrhage associated with the T6 fracture, suggesting possible acuity. 3. Aortic Atherosclerosis (ICD10-I70.0) and Emphysema (ICD10-J43.9). 4. Contour irregularity of the liver suspicious for cirrhosis. Cholelithiasis. Electronically Signed   By: Carey Bullocks M.D.   On: 01/14/2019 14:14   Ct Cervical Spine Wo Contrast  Result Date: 01/14/2019 CLINICAL DATA:  Head trauma, headache EXAM: CT HEAD WITHOUT CONTRAST CT CERVICAL SPINE WITHOUT CONTRAST TECHNIQUE: Multidetector CT imaging of the head and cervical spine was performed following the standard protocol without intravenous contrast. Multiplanar CT image reconstructions of the cervical spine were also generated. COMPARISON:  None. FINDINGS: CT HEAD FINDINGS Brain: Generalized atrophy without hydrocephalus. Diffuse white matter hypodensity bilaterally appears chronic. Negative for acute infarct, hemorrhage, or mass. No midline shift. Vascular: Negative for hyperdense  vessel Skull: Negative Sinuses/Orbits: Mucosal edema paranasal sinuses. Bilateral cataract surgery. Other: None CT CERVICAL SPINE FINDINGS Alignment: Normal alignment Image quality degraded by patient motion. Skull base and vertebrae: Negative for fracture Soft tissues and spinal canal: Atherosclerotic calcification in the carotid artery bilaterally. Negative for soft tissue mass Disc levels: Mild disc and facet degeneration in the cervical spine. Upper chest: Lung apices clear bilaterally. Other: None IMPRESSION: 1. No acute intracranial abnormality. Atrophy with chronic white matter changes 2. Negative for cervical spine fracture.  Mild degenerative changes. Electronically Signed   By: Marlan Palau M.D.   On: 01/14/2019 14:05   Dg Hip Unilat W Or Wo Pelvis 2-3 Views Left  Result Date: 01/14/2019 CLINICAL DATA:  Fall last night.  Back and left hip pain. EXAM: DG HIP (WITH OR WITHOUT PELVIS) 2-3V LEFT COMPARISON:  None. FINDINGS: Examination is limited by body habitus and positioning. The bones appear demineralized. No definite evidence of acute fracture or dislocation. However, the left femoral neck is suboptimally visualized. Mild degenerative changes of the hips, sacroiliac joints and lower lumbar spine. IMPRESSION: No definite acute osseous findings. Evaluation of the left hip is limited by body habitus and positioning. Consider CT for more definitive evaluation. Electronically Signed   By: Carey Bullocks M.D.   On: 01/14/2019 12:28    Pending Labs Unresulted Labs (From admission, onward)    Start     Ordered   01/14/19 1058  Blood culture (routine x 2)  BLOOD CULTURE X 2,   STAT     01/14/19 1059   Signed and Held  CBC  Tomorrow morning,   R     Signed and Held   Signed and Held  Comprehensive metabolic panel  Tomorrow morning,   R     Signed and Held   Signed and Held  Troponin I - Now Then Q6H  Now then every 6 hours,   R     Signed and Held   Signed and Held  MRSA PCR Screening  Once,    R     Signed  and Held   Signed and Held  Lactic acid, plasma  STAT Now then every 3 hours,   R     Signed and Held          Vitals/Pain Today's Vitals   01/14/19 1402 01/14/19 1402 01/14/19 1415 01/14/19 1530  BP:  (!) 110/53 114/74 136/83  Pulse:  80 82 74  Resp:  (!) 24 (!) 23 (!) 25  Temp:      TempSrc:      SpO2:  100% 100% 100%  Weight:      Height:      PainSc: 9        Isolation Precautions No active isolations  Medications Medications  sodium chloride 0.9 % bolus 1,000 mL (has no administration in time range)  vancomycin (VANCOCIN) 2,000 mg in sodium chloride 0.9 % 500 mL IVPB (2,000 mg Intravenous New Bag/Given 01/14/19 1511)  vancomycin variable dose per unstable renal function (pharmacist dosing) (has no administration in time range)  ceFEPIme (MAXIPIME) 2 g in sodium chloride 0.9 % 100 mL IVPB (has no administration in time range)  sodium chloride 0.9 % bolus 1,000 mL (1,000 mLs Intravenous New Bag/Given 01/14/19 1618)  sodium chloride 0.9 % bolus 1,000 mL (0 mLs Intravenous Stopped 01/14/19 1618)  piperacillin-tazobactam (ZOSYN) IVPB 3.375 g (0 g Intravenous Stopped 01/14/19 1510)    Mobility walks with device High fall risk   Focused Assessments Cardiac Assessment Handoff:  Cardiac Rhythm: Normal sinus rhythm Lab Results  Component Value Date   CKTOTAL 10,002 (H) 01/14/2019   TROPONINI 0.07 (HH) 01/14/2019   No results found for: DDIMER Does the Patient currently have chest pain? Yes     R Recommendations: See Admitting Provider Note  Report given to:   Additional Notes:

## 2019-01-14 NOTE — ED Triage Notes (Addendum)
Pt from home via ems; pt has mechanical fall around 2200 last night, fell backwards onto toilet, unable to get up; unknown loc; c/o R sided cp that began last night, sharp, 10/10; no increased pain w/ palpation; denies sob, dizziness; hx copd, recently diagnosed with pneumonia, tested for covid-19, came back negative; all 4 extremities cyanotic and cool to touch; pt wears 2L at home; c/o L hip pain, some rotation, little to no feeling in L foot, unable to wiggle toes; per ems, describes as found in an "upward fetal position", feet very cyanotic on ems arrival, has regained some color; abrasions to L arm, productive cough present x 2 weeks; 324 asa and 1200 NS given PTA   104/62 HR 90 86% 15L w/ ems;  100% 2L at present capnography 30 CBG 130

## 2019-01-15 ENCOUNTER — Inpatient Hospital Stay (HOSPITAL_COMMUNITY): Payer: Medicare Other

## 2019-01-15 ENCOUNTER — Other Ambulatory Visit (HOSPITAL_COMMUNITY): Payer: Medicare Other

## 2019-01-15 DIAGNOSIS — R7989 Other specified abnormal findings of blood chemistry: Secondary | ICD-10-CM

## 2019-01-15 DIAGNOSIS — I5032 Chronic diastolic (congestive) heart failure: Secondary | ICD-10-CM

## 2019-01-15 DIAGNOSIS — R209 Unspecified disturbances of skin sensation: Secondary | ICD-10-CM

## 2019-01-15 DIAGNOSIS — J189 Pneumonia, unspecified organism: Secondary | ICD-10-CM

## 2019-01-15 DIAGNOSIS — N179 Acute kidney failure, unspecified: Secondary | ICD-10-CM

## 2019-01-15 DIAGNOSIS — I509 Heart failure, unspecified: Secondary | ICD-10-CM

## 2019-01-15 DIAGNOSIS — T796XXA Traumatic ischemia of muscle, initial encounter: Secondary | ICD-10-CM

## 2019-01-15 LAB — COMPREHENSIVE METABOLIC PANEL
ALT: 39 U/L (ref 0–44)
AST: 244 U/L — ABNORMAL HIGH (ref 15–41)
Albumin: 2 g/dL — ABNORMAL LOW (ref 3.5–5.0)
Alkaline Phosphatase: 63 U/L (ref 38–126)
Anion gap: 15 (ref 5–15)
BUN: 55 mg/dL — ABNORMAL HIGH (ref 8–23)
CO2: 22 mmol/L (ref 22–32)
Calcium: 7.9 mg/dL — ABNORMAL LOW (ref 8.9–10.3)
Chloride: 104 mmol/L (ref 98–111)
Creatinine, Ser: 2.75 mg/dL — ABNORMAL HIGH (ref 0.61–1.24)
GFR calc Af Amer: 26 mL/min — ABNORMAL LOW (ref >60)
GFR calc non Af Amer: 22 mL/min — ABNORMAL LOW (ref >60)
Glucose, Bld: 65 mg/dL — ABNORMAL LOW (ref 70–99)
Potassium: 5.1 mmol/L (ref 3.5–5.1)
Sodium: 141 mmol/L (ref 135–145)
Total Bilirubin: 1.3 mg/dL — ABNORMAL HIGH (ref 0.3–1.2)
Total Protein: 4.6 g/dL — ABNORMAL LOW (ref 6.5–8.1)

## 2019-01-15 LAB — URINALYSIS, ROUTINE W REFLEX MICROSCOPIC
Bilirubin Urine: NEGATIVE
Glucose, UA: NEGATIVE mg/dL
Ketones, ur: NEGATIVE mg/dL
Nitrite: NEGATIVE
Protein, ur: NEGATIVE mg/dL
Specific Gravity, Urine: 1.014 (ref 1.005–1.030)
pH: 5 (ref 5.0–8.0)

## 2019-01-15 LAB — CBC
HCT: 41.1 % (ref 39.0–52.0)
Hemoglobin: 12.7 g/dL — ABNORMAL LOW (ref 13.0–17.0)
MCH: 29.3 pg (ref 26.0–34.0)
MCHC: 30.9 g/dL (ref 30.0–36.0)
MCV: 94.9 fL (ref 80.0–100.0)
Platelets: 207 10*3/uL (ref 150–400)
RBC: 4.33 MIL/uL (ref 4.22–5.81)
RDW: 15.1 % (ref 11.5–15.5)
WBC: 18.3 10*3/uL — ABNORMAL HIGH (ref 4.0–10.5)
nRBC: 0 % (ref 0.0–0.2)

## 2019-01-15 LAB — CK
Total CK: 7273 U/L — ABNORMAL HIGH (ref 49–397)
Total CK: 5800 U/L — ABNORMAL HIGH (ref 49–397)

## 2019-01-15 LAB — ECHOCARDIOGRAM LIMITED
Height: 70 in
Weight: 5680 oz

## 2019-01-15 LAB — SODIUM, URINE, RANDOM: Sodium, Ur: 56 mmol/L

## 2019-01-15 LAB — CREATININE, URINE, RANDOM: Creatinine, Urine: 119.45 mg/dL

## 2019-01-15 LAB — LACTIC ACID, PLASMA: Lactic Acid, Venous: 2.6 mmol/L (ref 0.5–1.9)

## 2019-01-15 LAB — TROPONIN I
Troponin I: 0.05 ng/mL (ref <0.03)
Troponin I: 0.05 ng/mL (ref <0.03)

## 2019-01-15 MED ORDER — FUROSEMIDE 10 MG/ML IJ SOLN
40.0000 mg | Freq: Once | INTRAMUSCULAR | Status: AC
  Administered 2019-01-15: 40 mg via INTRAVENOUS
  Filled 2019-01-15: qty 4

## 2019-01-15 MED ORDER — HYDROMORPHONE HCL 1 MG/ML IJ SOLN: 0.5000 mg | Freq: Three times a day (TID) | INTRAMUSCULAR | Status: DC | PRN

## 2019-01-15 MED ORDER — GERHARDT'S BUTT CREAM
TOPICAL_CREAM | Freq: Four times a day (QID) | CUTANEOUS | Status: DC
  Administered 2019-01-15 – 2019-01-16 (×6): via TOPICAL
  Administered 2019-01-16: 1 via TOPICAL
  Administered 2019-01-16: 14:00:00 via TOPICAL
  Administered 2019-01-17: 1 via TOPICAL
  Administered 2019-01-17 – 2019-01-23 (×17): via TOPICAL
  Filled 2019-01-15 (×3): qty 1

## 2019-01-15 MED ORDER — VANCOMYCIN HCL 10 G IV SOLR
1750.0000 mg | INTRAVENOUS | Status: DC
  Administered 2019-01-15 – 2019-01-16 (×2): 1750 mg via INTRAVENOUS
  Filled 2019-01-15 (×3): qty 1750

## 2019-01-15 MED ORDER — OXYCODONE HCL 5 MG PO TABS
5.0000 mg | ORAL_TABLET | Freq: Four times a day (QID) | ORAL | Status: DC | PRN
  Administered 2019-01-15 – 2019-01-16 (×3): 5 mg via ORAL
  Filled 2019-01-15 (×3): qty 1

## 2019-01-15 NOTE — Consult Note (Signed)
WOC Nurse wound consult note Patient receiving care in [facility] 3E11.  Assisted with turning, positioning, and cleansing patient of a massive brown, pudding consistency BM by RN and NT.  Mobility aide assisted at conclusion to pull patient up in the [employer] low air loss bed.  Patient states he was sitting on his scrotum for an extended period of time as he could not get up. 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  No wound on the sacrum Pressure Injury POA: Yes Measurements: The DPTI of the scrotum involves the entire scrotum.  There is scattered peeling of the overlying skin.  The scrotum is purple and maroon. The right posterior thigh DTPI measures 15 cm x 15 cm and joins the scrotal DTPI.  There is some scattered peeling of the overlying skin in spots.  The wound in purple/maroon. The left posterior thigh DTPI is 15 cm x 6 cm and joins the scrotal DTPI.  There is scattered peeling of overlying skin.  The entire wound is purple/maroon. Dressing procedure/placement/frequency: Gerhardt's butt cream for the thigh wounds, 4 times daily.  Xeroform gauze [place] # [phone]) wrapped around the scrotum each shift.  It is not uncommon for DTPIs to evolve into unstageable PIs.  If this occurs, treatment will likely need changing. Monitor the wound area(s) for worsening of condition such as: Signs/symptoms of infection,  Increase in size,  Development of or worsening of odor, Development of pain, or increased pain at the affected locations.  Notify the medical team if any of these develop.  Thank you for the consult.  Discussed plan of care with the patient and bedside nurse.  WOC nurse will not follow at this time.  Please re-consult the WOC team if needed.  Helmut Muster, RN, MSN, CWOCN, CNS-BC, pager [phone]

## 2019-01-15 NOTE — Progress Notes (Signed)
ABI's have been completed. Preliminary results can be found in CV Proc through chart review.   01/15/19 3:39 PM Olen Cordial RVT

## 2019-01-15 NOTE — Consult Note (Signed)
Reason for Consult: Acute kidney injury Referring Physician: Lanae Boast M.D. [facility])  HPI:  73 year old Caucasian man with history of oxygen dependent COPD, hypertension, diastolic heart failure and normal renal function at baseline who was recently discharged from [facility] long hospital after an admission for COPD exacerbation/COVID-19 rule out.  He suffered a fall on Saturday night backwards onto the toilet and was unable to get up.  He originally complained of right-sided chest pain and shoulder pain but when seen today he is complaining of excruciating left-sided chest and shoulder pain intermittently moaning and yelling during the visit.  He denies any preceding nausea, vomiting or diarrhea.  He denies any dysuria or hematuria and reports some recent worsening of his lower extremity swelling.  He denies any fevers or chills.  Past Medical History:  Diagnosis Date  . CHF (congestive heart failure) (HCC)   . COPD (chronic obstructive pulmonary disease) (HCC)    2L O2 at baseline  . HTN (hypertension)   . RA (rheumatoid arthritis) (HCC)    Not on immunosuppresants currently it looks like    Past Surgical History:  Procedure Laterality Date  . LEFT HEART CATH      Family History  Problem Relation Age of Onset  . Heart disease Father   . Diabetes Brother   . Cancer Maternal Grandmother   . Hypertension Neg Hx   . Stroke Neg Hx     Social History:  reports that he has quit smoking. His smoking use included cigarettes. He has never used smokeless tobacco. He reports current alcohol use. He reports that he does not use drugs.  Allergies:  Allergies  Allergen Reactions  . Naproxen Sodium Rash    Medications:  Scheduled: . carvedilol  3.125 mg Oral BID  . Gerhardt's butt cream   Topical QID  . heparin  5,000 Units Subcutaneous Q8H  . mometasone-formoterol  2 puff Inhalation BID  . sodium chloride flush  3 mL Intravenous Q12H   BMP Latest Ref Rng & Units 01/15/2019 01/14/2019  01/04/2019  Glucose 70 - 99 mg/dL 16(X) 85 96  BUN 8 - 23 mg/dL 09(U) 04(V) 40(J)  Creatinine 0.61 - 1.24 mg/dL 8.11(B) 1.47(W) 2.95  Sodium 135 - 145 mmol/L 141 141 135  Potassium 3.5 - 5.1 mmol/L 5.1 5.4(H) 4.3  Chloride 98 - 111 mmol/L 104 102 99  CO2 22 - 32 mmol/L Calcium 8.9 - 10.3 mg/dL 7.9(L) 8.5(L) 8.3(L)   CBC Latest Ref Rng & Units 01/15/2019 01/14/2019 01/04/2019  WBC 4.0 - 10.5 K/uL 18.3(H) 24.0(H) 11.5(H)  Hemoglobin 13.0 - 17.0 g/dL 12.7(L) 13.4 12.6(L)  Hematocrit 39.0 - 52.0 % 41.1 45.4 42.2  Platelets 150 - 400 K/uL 207 291 301   Urinalysis    Component Value Date/Time   COLORURINE AMBER (A) 01/15/2019 1032   APPEARANCEUR CLOUDY (A) 01/15/2019 1032   LABSPEC 1.014 01/15/2019 1032   PHURINE 5.0 01/15/2019 1032   GLUCOSEU NEGATIVE 01/15/2019 1032   HGBUR LARGE (A) 01/15/2019 1032   BILIRUBINUR NEGATIVE 01/15/2019 1032   KETONESUR NEGATIVE 01/15/2019 1032   PROTEINUR NEGATIVE 01/15/2019 1032   NITRITE NEGATIVE 01/15/2019 1032   LEUKOCYTESUR TRACE (A) 01/15/2019 1032    Dg Chest 2 View  Result Date: 01/14/2019 CLINICAL DATA:  Dyspnea EXAM: CHEST - 2 VIEW COMPARISON:  01/01/2019 chest radiograph. FINDINGS: Low lung volumes. Stable cardiomediastinal silhouette with mild cardiomegaly. No pneumothorax. Patchy opacity at both lung bases. No overt pulmonary edema. IMPRESSION: Low lung volumes. Patchy bibasilar  lung opacities, differential includes aspiration, pneumonia or atelectasis. Chest radiograph follow-up advised. Electronically Signed   By: Delbert Phenix M.D.   On: 01/14/2019 12:24   Dg Lumbar Spine Complete  Result Date: 01/14/2019 CLINICAL DATA:  Fall last night.  Back and left hip pain. EXAM: LUMBAR SPINE - COMPLETE 4+ VIEW COMPARISON:  Lumbar spine CT 09/18/2008. FINDINGS: There are 5 lumbar type vertebral bodies. The bones are mildly demineralized. No evidence of acute fracture or pars defect. The disc spaces are preserved. Moderate facet degenerative  changes are present inferiorly. There are bilateral sacroiliac degenerative changes. There is diffuse aortic atherosclerosis. IMPRESSION: No evidence of acute lumbar spine injury. Facet arthropathy and aortic atherosclerosis noted. Electronically Signed   By: Carey Bullocks M.D.   On: 01/14/2019 12:30   Dg Knee 1-2 Views Left  Result Date: 01/15/2019 CLINICAL DATA:  PAIN AND SWELLING POST FALL EXAM: LEFT KNEE - 1-2 VIEW COMPARISON:  None. FINDINGS: Marginal spurs about all 3 compartments of the knee. Mild narrowing of the articular cartilage in the medial compartment. Negative for fracture or dislocation. No definite effusion. Normal alignment. Patchy popliteal arterial calcifications. IMPRESSION: 1. Negative for fracture or other acute bone abnormality. 2. Tricompartmental degenerative change, most marked medially. Electronically Signed   By: Corlis Leak M.D.   On: 01/15/2019 11:30   Ct Head Wo Contrast  Result Date: 01/14/2019 CLINICAL DATA:  Head trauma, headache EXAM: CT HEAD WITHOUT CONTRAST CT CERVICAL SPINE WITHOUT CONTRAST TECHNIQUE: Multidetector CT imaging of the head and cervical spine was performed following the standard protocol without intravenous contrast. Multiplanar CT image reconstructions of the cervical spine were also generated. COMPARISON:  None. FINDINGS: CT HEAD FINDINGS Brain: Generalized atrophy without hydrocephalus. Diffuse white matter hypodensity bilaterally appears chronic. Negative for acute infarct, hemorrhage, or mass. No midline shift. Vascular: Negative for hyperdense vessel Skull: Negative Sinuses/Orbits: Mucosal edema paranasal sinuses. Bilateral cataract surgery. Other: None CT CERVICAL SPINE FINDINGS Alignment: Normal alignment Image quality degraded by patient motion. Skull base and vertebrae: Negative for fracture Soft tissues and spinal canal: Atherosclerotic calcification in the carotid artery bilaterally. Negative for soft tissue mass Disc levels: Mild disc and  facet degeneration in the cervical spine. Upper chest: Lung apices clear bilaterally. Other: None IMPRESSION: 1. No acute intracranial abnormality. Atrophy with chronic white matter changes 2. Negative for cervical spine fracture.  Mild degenerative changes. Electronically Signed   By: Marlan Palau M.D.   On: 01/14/2019 14:05   Ct Chest Wo Contrast  Result Date: 01/14/2019 CLINICAL DATA:  Right chest pain since falling last night. History of CHF, COPD and rheumatoid arthritis. EXAM: CT CHEST WITHOUT CONTRAST TECHNIQUE: Multidetector CT imaging of the chest was performed following the standard protocol without IV contrast. COMPARISON:  Chest radiographs today and 01/01/2019. Report only from chest CT 06/10/2005. FINDINGS: Cardiovascular: Moderate atherosclerosis of the aorta, great vessels and coronary arteries. No acute vascular findings on noncontrast imaging. The heart size is normal. There is no pericardial effusion. Mediastinum/Nodes: There are no enlarged mediastinal, hilar or axillary lymph nodes.Hilar assessment is limited by the lack of intravenous contrast. The thyroid gland, trachea and esophagus demonstrate no significant findings. Lungs/Pleura: There is no pleural effusion or pneumothorax. Mild centrilobular emphysema is present. There are patchy airspace opacities at both lung bases which are likely inflammatory. There is no consolidation or suspicious pulmonary nodule. There is a 6 mm perifissural nodule along the right middle lobe on image 69/5 which is likely a lymph node. Upper abdomen: Diffuse  contour irregularity of the liver suspicious for cirrhosis. The liver also demonstrates decreased density consistent with steatosis. No focal lesion identified on noncontrast imaging. There are multiple layering calcified gallstones. There is a probable cyst posteriorly in the mid left kidney, incompletely visualized. Musculoskeletal/Chest wall: There are anterior compression deformities at T6 and T7  which are age indeterminate, not previously imaged. The T6 fracture results in greater than 50% loss vertebral body height, and there may be mild paraspinal hemorrhage at this level suggesting acuity. IMPRESSION: 1. Patchy bibasilar airspace opacities, consistent with pneumonia, possibly on the basis of aspiration. Followup PA and lateral chest X-ray is recommended in 3-4 weeks following trial of antibiotic therapy to ensure resolution and exclude underlying malignancy. 2. Age-indeterminate compression fractures at T6 and T7. There may be mild paraspinal hemorrhage associated with the T6 fracture, suggesting possible acuity. 3. Aortic Atherosclerosis (ICD10-I70.0) and Emphysema (ICD10-J43.9). 4. Contour irregularity of the liver suspicious for cirrhosis. Cholelithiasis. Electronically Signed   By: Carey BullocksWilliam  Veazey M.D.   On: 01/14/2019 14:14   Ct Cervical Spine Wo Contrast  Result Date: 01/14/2019 CLINICAL DATA:  Head trauma, headache EXAM: CT HEAD WITHOUT CONTRAST CT CERVICAL SPINE WITHOUT CONTRAST TECHNIQUE: Multidetector CT imaging of the head and cervical spine was performed following the standard protocol without intravenous contrast. Multiplanar CT image reconstructions of the cervical spine were also generated. COMPARISON:  None. FINDINGS: CT HEAD FINDINGS Brain: Generalized atrophy without hydrocephalus. Diffuse white matter hypodensity bilaterally appears chronic. Negative for acute infarct, hemorrhage, or mass. No midline shift. Vascular: Negative for hyperdense vessel Skull: Negative Sinuses/Orbits: Mucosal edema paranasal sinuses. Bilateral cataract surgery. Other: None CT CERVICAL SPINE FINDINGS Alignment: Normal alignment Image quality degraded by patient motion. Skull base and vertebrae: Negative for fracture Soft tissues and spinal canal: Atherosclerotic calcification in the carotid artery bilaterally. Negative for soft tissue mass Disc levels: Mild disc and facet degeneration in the cervical  spine. Upper chest: Lung apices clear bilaterally. Other: None IMPRESSION: 1. No acute intracranial abnormality. Atrophy with chronic white matter changes 2. Negative for cervical spine fracture.  Mild degenerative changes. Electronically Signed   By: Marlan Palauharles  Clark M.D.   On: 01/14/2019 14:05   Dg Hip Unilat W Or Wo Pelvis 2-3 Views Left  Result Date: 01/14/2019 CLINICAL DATA:  Fall last night.  Back and left hip pain. EXAM: DG HIP (WITH OR WITHOUT PELVIS) 2-3V LEFT COMPARISON:  None. FINDINGS: Examination is limited by body habitus and positioning. The bones appear demineralized. No definite evidence of acute fracture or dislocation. However, the left femoral neck is suboptimally visualized. Mild degenerative changes of the hips, sacroiliac joints and lower lumbar spine. IMPRESSION: No definite acute osseous findings. Evaluation of the left hip is limited by body habitus and positioning. Consider CT for more definitive evaluation. Electronically Signed   By: Carey BullocksWilliam  Veazey M.D.   On: 01/14/2019 12:28    Review of Systems  Constitutional: Positive for malaise/fatigue. Negative for chills and fever.  HENT: Negative.   Eyes: Negative.   Respiratory: Negative.   Cardiovascular: Positive for chest pain and leg swelling. Negative for orthopnea.       Musculoskeletal chest wall pain  Gastrointestinal: Negative.   Genitourinary: Negative.   Musculoskeletal: Positive for back pain, falls and myalgias.  Skin: Negative.    Blood pressure 106/84, pulse (!) 103, temperature 98.4 F (36.9 C), temperature source Oral, resp. rate 20, height 5\' 10"  (1.778 m), weight (!) 161 kg, SpO2 96 %. Physical Exam  Nursing note and  vitals reviewed. Constitutional: He is oriented to person, place, and time. He appears well-nourished. He appears distressed.  Morbidly obese man, uncomfortable  HENT:  Head: Normocephalic and atraumatic.  Mouth/Throat: Oropharynx is clear and moist.  Eyes: Pupils are equal, round, and  reactive to light. EOM are normal.  Neck: Normal range of motion. Neck supple. No JVD present. No tracheal deviation present.  Cardiovascular: Regular rhythm.  No murmur heard. Regular tachycardia  Respiratory: Effort normal and breath sounds normal. He has no wheezes. He has no rales.  GI: Soft. Bowel sounds are normal. There is no abdominal tenderness. There is no rebound.  Musculoskeletal:        General: Edema present.     Comments: 1-2+ lower extremity edema  Neurological: He is alert and oriented to person, place, and time.  Skin: Skin is warm and dry.    Assessment/Plan: 1.  Acute kidney injury: Clinically challenging diagnosis without helpful contributory history from the patient.  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  As a unifying management strategy, I will send off for urine electrolytes and give him isotonic fluids along with diuretics to help promote urine output.  No indications for dialysis at this time.  We will stop IV fluids when urine output appears to be sufficiently high. 2.  Hyperkalemia: Mild and secondary to acute kidney injury versus muscle injury.  Monitor with IV fluids/diuretics. 3.  Fall/mild rhabdomyolysis: Ongoing evaluation for possible etiology of fall. 4.  History of diastolic heart failure: 2D echocardiogram repeated today 5.  Possible pneumonia/leukocytosis: On broad-spectrum antimicrobial therapy with vancomycin and Zosyn.  Follow this closely/renally adjust to avoid worsening renal insufficiency.  Stefannie Defeo K. 01/15/2019, 12:12 PM

## 2019-01-15 NOTE — Progress Notes (Signed)
PROGRESS NOTE    Yetta BarreRobert L Bryden  ZOX:[id number] DOB: 01/16/1946 DOA: 01/14/2019 PCP: Patient, No Pcp Per   Brief Narrative: 73 year old male with COPD chronic respiratory failure on 2 L nasal cannula, HTN, CHF (D), [facility] for COPD exacerbation, COVID 19 NEGATIVE and was discharged home, brought from home after at fall 10 PM 01/13/2019 in the toilet, fell backward was unable to get up.  Was also complaining of right-sided chest pain worse with palpation, in the ER found to have AKI with creatinine 2.5 elevated LFTs, CK of 10,000, troponin 0 0.07, lactic acid 5.2 WBC 20 4K chest x-ray with patchy bibasilar airspace opacities consistent with pneumonia, age indeterminate compression fracture at T6-T7 and was admitted. I called his sister 4/13 who states patient has been dealing with leg swelling for 3 months and chronic back pain worse in the past 2 weeks and overall he has not been doing well.  Subjective: Patient still complains of pain which is on left knee mid back, difficult to lay flat.  Denies nausea vomiting shortness of breath fever or chills.  Assessment & Plan:  HCAP: With leukocytosis, abnormal chest x-ray/CT. WBC downtrending, of note was also on recent steroids from last discharge.  Had lactic acidosis on admission-sepsis versus related to recent steroids/abnormalities.  Hemodynamically stable, afebrile, wbc and lactate downtrending.  We will continue empiric antibiotics Vanco/cefepime.MRSA screen negative, blood culture pending  Recent Labs  Lab 01/14/19 1058 01/15/19 0508  WBC 24.0* 18.3*    Fall in bathroom at home: Work-up with CT head/neck, Xray  lumbar spine and left hip head, neck/cervical spine, lumbar x-ray with no acute finding.  Age indeterminate compression fracture of T6-T7.MRI  T/L Spine and lt hip ordered for further evaluation. Obtain Xray knee as he is c/o lt knee pain. Cont pain control. PT/OT after MRI.  Chronic back pain pain for 3  months or longer but worse in 2 weeks, no priro fall as per sister. Mri pending.on oxycodone 10 mg q 4hr.  AKI, baseline creatinine around 0.8, and now in 2.7.Suspect multifactorial in the setting of pneumonia, rhabdomyolysis, CHF possible hypertension.  Blood pressure soft at holding parameters for Coreg, Lasix on hold and gentle IV fluids.  Will convert external to internal Foley catheter for accurate intake output as not much output, nephrology consulted to assist. Check UA. Recent Labs  Lab 01/14/19 1058 01/15/19 0508  CREATININE 2.51* 2.75*   Rhabdomyolysis Traumatic: CK 10,k-->7k.Hold lipitor 10 mg. Cont ivf amd trend CK.  Transaminitis:w Elevated isolated AST , likely in the setting of rhabdomyolysis.  Monitor.  Chronic diastolic CHF w fluid overload leg swelling got 3 months /HTN : On Lasix at home, bnp at 128 ( but obese)currently on hold given his x-ray however he has significant lower extremity edema, nephrology consulted to assist with fluid balance  Elevated troponin, mildly + and flat: Suspect this is secondary to fall/rhabdo.  He has right-sided chest pain, no overt ischemic changes on the EKG. Has reproducible right-sided chest pain likely musculoskeletal.TTE w lvef 55-60% and normal LV and RV size and fun. Recent Labs  Lab 01/14/19 1058 01/14/19 1849 01/14/19 2356 01/15/19 0508  TROPONINI 0.07* 0.08* 0.05* 0.05*   COPD with chronic hypoxic respiratory failure on 2 L nasal cannula: On home oxygen, stable, continue bronchodilators  Morbid obesity with BMI 50: Weight loss and healthy lifestyle would be beneficial but not sure if this is feasible given his age and  complex comorbidities.  HLD-hold lipitor  Suspected bilateral PVD: ABI pending  DVT prophylaxis: SQ Heparin Code Status: Full code Family Communication:Discussed labs, findings and plan of care with the patient.   Updated pt's contact- sister, who requests to be notified if any clinical situation changes and  requesting more pain meds as he is having these pain for over 3 months  Disposition Plan: remains inpatient pending clinical improvement.  Consultants:  Nephrology  Procedures:  Antimicrobials: Anti-infectives (From admission, onward)   Start     Dose/Rate Route Frequency Ordered Stop   01/15/19 1500  vancomycin (VANCOCIN) 1,750 mg in sodium chloride 0.9 % 500 mL IVPB     1,750 mg 250 mL/hr over 120 Minutes Intravenous Every 24 hours 01/15/19 0929     01/14/19 2200  piperacillin-tazobactam (ZOSYN) IVPB 3.375 g  Status:  Discontinued     3.375 g 12.5 mL/hr over 240 Minutes Intravenous Every 8 hours 01/14/19 1412 01/14/19 1544   01/14/19 1800  ceFEPIme (MAXIPIME) 2 g in sodium chloride 0.9 % 100 mL IVPB     2 g 200 mL/hr over 30 Minutes Intravenous Every 24 hours 01/14/19 1546     01/14/19 1415  piperacillin-tazobactam (ZOSYN) IVPB 3.375 g     3.375 g 100 mL/hr over 30 Minutes Intravenous  Once 01/14/19 1412 01/14/19 1510   01/14/19 1415  vancomycin (VANCOCIN) 2,000 mg in sodium chloride 0.9 % 500 mL IVPB     2,000 mg 250 mL/hr over 120 Minutes Intravenous  Once 01/14/19 1412 01/14/19 1737   01/14/19 1410  vancomycin variable dose per unstable renal function (pharmacist dosing)  Status:  Discontinued      Does not apply See admin instructions 01/14/19 1412 01/15/19 0929       Objective: Vitals:   01/14/19 2056 01/15/19 0120 01/15/19 0429 01/15/19 0832  BP: (!) 96/57 (!) 98/52  106/84  Pulse: 76 77 77 (!) 103  Resp: 16 (!) 24 20 20   Temp: 97.7 F (36.5 C) 98.3 F (36.8 C) 98.4 F (36.9 C) 98.4 F (36.9 C)  TempSrc: Oral  Oral Oral  SpO2: 96% 95%  96%  Weight:  (!) 161 kg    Height:        Intake/Output Summary (Last 24 hours) at 01/15/2019 0951 Last data filed at 01/15/2019 0542 Gross per 24 hour  Intake 1158.39 ml  Output 350 ml  Net 808.39 ml   Filed Weights   01/14/19 1038 01/14/19 1825 01/15/19 0120  Weight: (!) 154.7 kg (!) 159.7 kg (!) 161 kg   Weight  change:   Body mass index is 50.94 kg/m.  Intake/Output from previous day: 04/12 0701 - 04/13 0700 In: 1158.4 [P.O.:600; I.V.:408.4; IV Piggyback:150] Out: 350 [Urine:350] Intake/Output this shift: No intake/output data recorded.  Examination:  General exam: Appears In pain, obese, chronically sick looking, mild distress HEENT:PERRL,Oral mucosa moist, Ear/Nose normal on gross exam Respiratory system: Bilateral diminished breath sounds, no use of accessory respiratory muscles. Cardiovascular system: S1 & S2 heard,No JVD, murmurs. Gastrointestinal system: Abdomen is obese, non tender, non distended, BS +  Nervous System:Alert and oriented. No focal neurological deficits/moving extremities, sensation intact. Extremities: b/l edema present in the leg up to thigh, tender left knee  Skin: No rashes, lesions, no icterus MSK: Normal muscle bulk,tone ,power  Medications:  Scheduled Meds:  carvedilol  3.125 mg Oral BID   heparin  5,000 Units Subcutaneous Q8H   mometasone-formoterol  2 puff Inhalation BID   sodium chloride flush  3 mL Intravenous Q12H   Continuous  Infusions:  sodium chloride 50 mL/hr at 01/14/19 1849   sodium chloride 250 mL (01/14/19 1808)   ceFEPime (MAXIPIME) IV 2 g (01/14/19 1811)   vancomycin      Data Reviewed: I have personally reviewed following labs and imaging studies  CBC: Recent Labs  Lab 01/14/19 1058 01/15/19 0508  WBC 24.0* 18.3*  NEUTROABS 22.6*  --   HGB 13.4 12.7*  HCT 45.4 41.1  MCV 95.8 94.9  PLT 291 207   Basic Metabolic Panel: Recent Labs  Lab 01/14/19 1058 01/15/19 0508  NA 141 141  K 5.4* 5.1  CL 102 104  CO2 23 22  GLUCOSE 85 65*  BUN 37* 55*  CREATININE 2.51* 2.75*  CALCIUM 8.5* 7.9*   GFR: Estimated Creatinine Clearance: 37.7 mL/min (A) (by C-G formula based on SCr of 2.75 mg/dL (H)). Liver Function Tests: Recent Labs  Lab 01/14/19 1058 01/15/19 0508  AST 209* 244*  ALT 38 39  ALKPHOS 59 63  BILITOT  2.3* 1.3*  PROT 5.9* 4.6*  ALBUMIN 2.8* 2.0*   No results for input(s): LIPASE, AMYLASE in the last 168 hours. No results for input(s): AMMONIA in the last 168 hours. Coagulation Profile: No results for input(s): INR, PROTIME in the last 168 hours. Cardiac Enzymes: Recent Labs  Lab 01/14/19 1058 01/14/19 1849 01/14/19 2356 01/15/19 0508  CKTOTAL 10,002*  --   --   --   TROPONINI 0.07* 0.08* 0.05* 0.05*   BNP (last 3 results) No results for input(s): PROBNP in the last 8760 hours. HbA1C: No results for input(s): HGBA1C in the last 72 hours. CBG: No results for input(s): GLUCAP in the last 168 hours. Lipid Profile: No results for input(s): CHOL, HDL, LDLCALC, TRIG, CHOLHDL, LDLDIRECT in the last 72 hours. Thyroid Function Tests: No results for input(s): TSH, T4TOTAL, FREET4, T3FREE, THYROIDAB in the last 72 hours. Anemia Panel: No results for input(s): VITAMINB12, FOLATE, FERRITIN, TIBC, IRON, RETICCTPCT in the last 72 hours. Sepsis Labs: Recent Labs  Lab 01/14/19 1115 01/14/19 1300 01/14/19 1849 01/14/19 2356  LATICACIDVEN 5.2* 4.1* 4.2* 2.6*    Recent Results (from the past 240 hour(s))  MRSA PCR Screening     Status: None   Collection Time: 01/14/19  5:37 PM  Result Value Ref Range Status   MRSA by PCR NEGATIVE NEGATIVE Final    Comment:        The GeneXpert MRSA Assay (FDA approved for NASAL specimens only), is one component of a comprehensive MRSA colonization surveillance program. It is not intended to diagnose MRSA infection nor to guide or monitor treatment for MRSA infections. Performed at [facility], [address], [place], [place] [address]       Radiology Studies: Dg Chest 2 View  Result Date: 01/14/2019 CLINICAL DATA:  Dyspnea EXAM: CHEST - 2 VIEW COMPARISON:  01/01/2019 chest radiograph. FINDINGS: Low lung volumes. Stable cardiomediastinal silhouette with mild cardiomegaly. No pneumothorax. Patchy opacity at both lung bases. No overt  pulmonary edema. IMPRESSION: Low lung volumes. Patchy bibasilar lung opacities, differential includes aspiration, pneumonia or atelectasis. Chest radiograph follow-up advised. Electronically Signed   By: Delbert Phenix M.D.   On: 01/14/2019 12:24   Dg Lumbar Spine Complete  Result Date: 01/14/2019 CLINICAL DATA:  Fall last night.  Back and left hip pain. EXAM: LUMBAR SPINE - COMPLETE 4+ VIEW COMPARISON:  Lumbar spine CT 09/18/2008. FINDINGS: There are 5 lumbar type vertebral bodies. The bones are mildly demineralized. No evidence of acute fracture or  pars defect. The disc spaces are preserved. Moderate facet degenerative changes are present inferiorly. There are bilateral sacroiliac degenerative changes. There is diffuse aortic atherosclerosis. IMPRESSION: No evidence of acute lumbar spine injury. Facet arthropathy and aortic atherosclerosis noted. Electronically Signed   By: Carey Bullocks M.D.   On: 01/14/2019 12:30   Ct Head Wo Contrast  Result Date: 01/14/2019 CLINICAL DATA:  Head trauma, headache EXAM: CT HEAD WITHOUT CONTRAST CT CERVICAL SPINE WITHOUT CONTRAST TECHNIQUE: Multidetector CT imaging of the head and cervical spine was performed following the standard protocol without intravenous contrast. Multiplanar CT image reconstructions of the cervical spine were also generated. COMPARISON:  None. FINDINGS: CT HEAD FINDINGS Brain: Generalized atrophy without hydrocephalus. Diffuse white matter hypodensity bilaterally appears chronic. Negative for acute infarct, hemorrhage, or mass. No midline shift. Vascular: Negative for hyperdense vessel Skull: Negative Sinuses/Orbits: Mucosal edema paranasal sinuses. Bilateral cataract surgery. Other: None CT CERVICAL SPINE FINDINGS Alignment: Normal alignment Image quality degraded by patient motion. Skull base and vertebrae: Negative for fracture Soft tissues and spinal canal: Atherosclerotic calcification in the carotid artery bilaterally. Negative for soft tissue  mass Disc levels: Mild disc and facet degeneration in the cervical spine. Upper chest: Lung apices clear bilaterally. Other: None IMPRESSION: 1. No acute intracranial abnormality. Atrophy with chronic white matter changes 2. Negative for cervical spine fracture.  Mild degenerative changes. Electronically Signed   By: Marlan Palau M.D.   On: 01/14/2019 14:05   Ct Chest Wo Contrast  Result Date: 01/14/2019 CLINICAL DATA:  Right chest pain since falling last night. History of CHF, COPD and rheumatoid arthritis. EXAM: CT CHEST WITHOUT CONTRAST TECHNIQUE: Multidetector CT imaging of the chest was performed following the standard protocol without IV contrast. COMPARISON:  Chest radiographs today and 01/01/2019. Report only from chest CT 06/10/2005. FINDINGS: Cardiovascular: Moderate atherosclerosis of the aorta, great vessels and coronary arteries. No acute vascular findings on noncontrast imaging. The heart size is normal. There is no pericardial effusion. Mediastinum/Nodes: There are no enlarged mediastinal, hilar or axillary lymph nodes.Hilar assessment is limited by the lack of intravenous contrast. The thyroid gland, trachea and esophagus demonstrate no significant findings. Lungs/Pleura: There is no pleural effusion or pneumothorax. Mild centrilobular emphysema is present. There are patchy airspace opacities at both lung bases which are likely inflammatory. There is no consolidation or suspicious pulmonary nodule. There is a 6 mm perifissural nodule along the right middle lobe on image 69/5 which is likely a lymph node. Upper abdomen: Diffuse contour irregularity of the liver suspicious for cirrhosis. The liver also demonstrates decreased density consistent with steatosis. No focal lesion identified on noncontrast imaging. There are multiple layering calcified gallstones. There is a probable cyst posteriorly in the mid left kidney, incompletely visualized. Musculoskeletal/Chest wall: There are anterior  compression deformities at T6 and T7 which are age indeterminate, not previously imaged. The T6 fracture results in greater than 50% loss vertebral body height, and there may be mild paraspinal hemorrhage at this level suggesting acuity. IMPRESSION: 1. Patchy bibasilar airspace opacities, consistent with pneumonia, possibly on the basis of aspiration. Followup PA and lateral chest X-ray is recommended in 3-4 weeks following trial of antibiotic therapy to ensure resolution and exclude underlying malignancy. 2. Age-indeterminate compression fractures at T6 and T7. There may be mild paraspinal hemorrhage associated with the T6 fracture, suggesting possible acuity. 3. Aortic Atherosclerosis (ICD10-I70.0) and Emphysema (ICD10-J43.9). 4. Contour irregularity of the liver suspicious for cirrhosis. Cholelithiasis. Electronically Signed   By: Hilarie Fredrickson.D.  On: 01/14/2019 14:14   Ct Cervical Spine Wo Contrast  Result Date: 01/14/2019 CLINICAL DATA:  Head trauma, headache EXAM: CT HEAD WITHOUT CONTRAST CT CERVICAL SPINE WITHOUT CONTRAST TECHNIQUE: Multidetector CT imaging of the head and cervical spine was performed following the standard protocol without intravenous contrast. Multiplanar CT image reconstructions of the cervical spine were also generated. COMPARISON:  None. FINDINGS: CT HEAD FINDINGS Brain: Generalized atrophy without hydrocephalus. Diffuse white matter hypodensity bilaterally appears chronic. Negative for acute infarct, hemorrhage, or mass. No midline shift. Vascular: Negative for hyperdense vessel Skull: Negative Sinuses/Orbits: Mucosal edema paranasal sinuses. Bilateral cataract surgery. Other: None CT CERVICAL SPINE FINDINGS Alignment: Normal alignment Image quality degraded by patient motion. Skull base and vertebrae: Negative for fracture Soft tissues and spinal canal: Atherosclerotic calcification in the carotid artery bilaterally. Negative for soft tissue mass Disc levels: Mild disc and  facet degeneration in the cervical spine. Upper chest: Lung apices clear bilaterally. Other: None IMPRESSION: 1. No acute intracranial abnormality. Atrophy with chronic white matter changes 2. Negative for cervical spine fracture.  Mild degenerative changes. Electronically Signed   By: Marlan Palauharles  Clark M.D.   On: 01/14/2019 14:05   Dg Hip Unilat W Or Wo Pelvis 2-3 Views Left  Result Date: 01/14/2019 CLINICAL DATA:  Fall last night.  Back and left hip pain. EXAM: DG HIP (WITH OR WITHOUT PELVIS) 2-3V LEFT COMPARISON:  None. FINDINGS: Examination is limited by body habitus and positioning. The bones appear demineralized. No definite evidence of acute fracture or dislocation. However, the left femoral neck is suboptimally visualized. Mild degenerative changes of the hips, sacroiliac joints and lower lumbar spine. IMPRESSION: No definite acute osseous findings. Evaluation of the left hip is limited by body habitus and positioning. Consider CT for more definitive evaluation. Electronically Signed   By: Carey BullocksWilliam  Veazey M.D.   On: 01/14/2019 12:28      LOS: 1 day   Time spent: More than 50% of that time was spent in counseling and/or coordination of care.  Lanae Boastamesh Aracely Rickett, MD [facility]  01/15/2019, 9:51 AM

## 2019-01-15 NOTE — Progress Notes (Signed)
  Echocardiogram 2D Echocardiogram has been performed.  [name] 01/15/2019, 11:30 AM

## 2019-01-15 NOTE — Progress Notes (Signed)
Attempted MRI 1215a 01/15/19.  Patient unable to lay flat or hold still, was screaming in pain through positioning and stating he couldn't do this.  Multiple efforts were made to try to comfort patient but the more we tried the worse it seemed to get for him.  No images were obtained because we could not get patient calmed down, he stated his ribs hurt too bad to hold still and do the exam.

## 2019-01-16 ENCOUNTER — Other Ambulatory Visit: Payer: Self-pay

## 2019-01-16 DIAGNOSIS — S22050A Wedge compression fracture of T5-T6 vertebra, initial encounter for closed fracture: Principal | ICD-10-CM

## 2019-01-16 DIAGNOSIS — J449 Chronic obstructive pulmonary disease, unspecified: Secondary | ICD-10-CM

## 2019-01-16 DIAGNOSIS — L8990 Pressure ulcer of unspecified site, unspecified stage: Secondary | ICD-10-CM

## 2019-01-16 DIAGNOSIS — I5032 Chronic diastolic (congestive) heart failure: Secondary | ICD-10-CM

## 2019-01-16 LAB — COMPREHENSIVE METABOLIC PANEL
ALT: 41 U/L (ref 0–44)
AST: 193 U/L — ABNORMAL HIGH (ref 15–41)
Albumin: 2.1 g/dL — ABNORMAL LOW (ref 3.5–5.0)
Alkaline Phosphatase: 62 U/L (ref 38–126)
Anion gap: 12 (ref 5–15)
BUN: 58 mg/dL — ABNORMAL HIGH (ref 8–23)
CO2: 25 mmol/L (ref 22–32)
Calcium: 8.1 mg/dL — ABNORMAL LOW (ref 8.9–10.3)
Chloride: 104 mmol/L (ref 98–111)
Creatinine, Ser: 2.16 mg/dL — ABNORMAL HIGH (ref 0.61–1.24)
GFR calc Af Amer: 34 mL/min — ABNORMAL LOW (ref >60)
GFR calc non Af Amer: 30 mL/min — ABNORMAL LOW (ref >60)
Glucose, Bld: 71 mg/dL (ref 70–99)
Potassium: 4.6 mmol/L (ref 3.5–5.1)
Sodium: 141 mmol/L (ref 135–145)
Total Bilirubin: 1.2 mg/dL (ref 0.3–1.2)
Total Protein: 5.1 g/dL — ABNORMAL LOW (ref 6.5–8.1)

## 2019-01-16 LAB — CBC
HCT: 41.9 % (ref 39.0–52.0)
Hemoglobin: 13 g/dL (ref 13.0–17.0)
MCH: 29.7 pg (ref 26.0–34.0)
MCHC: 31 g/dL (ref 30.0–36.0)
MCV: 95.9 fL (ref 80.0–100.0)
Platelets: 208 10*3/uL (ref 150–400)
RBC: 4.37 MIL/uL (ref 4.22–5.81)
RDW: 14.7 % (ref 11.5–15.5)
WBC: 17.9 10*3/uL — ABNORMAL HIGH (ref 4.0–10.5)
nRBC: 0 % (ref 0.0–0.2)

## 2019-01-16 LAB — CK
Total CK: 3922 U/L — ABNORMAL HIGH (ref 49–397)
Total CK: 3213 U/L — ABNORMAL HIGH (ref 49–397)

## 2019-01-16 LAB — UREA NITROGEN, URINE: Urea Nitrogen, Ur: 381 mg/dL

## 2019-01-16 MED ORDER — HYDROMORPHONE HCL 1 MG/ML IJ SOLN
0.5000 mg | INTRAMUSCULAR | Status: DC | PRN
  Administered 2019-01-17 – 2019-01-23 (×12): 0.5 mg via INTRAVENOUS
  Filled 2019-01-16 (×13): qty 0.5

## 2019-01-16 MED ORDER — LIDOCAINE 5 % EX PTCH
1.0000 | MEDICATED_PATCH | CUTANEOUS | Status: DC
  Administered 2019-01-16 – 2019-01-22 (×7): 1 via TRANSDERMAL
  Filled 2019-01-16 (×7): qty 1

## 2019-01-16 MED ORDER — OXYCODONE HCL 5 MG PO TABS
10.0000 mg | ORAL_TABLET | Freq: Four times a day (QID) | ORAL | Status: DC | PRN
  Administered 2019-01-16 – 2019-01-23 (×18): 10 mg via ORAL
  Filled 2019-01-16 (×19): qty 2

## 2019-01-16 MED ORDER — MORPHINE SULFATE (PF) 4 MG/ML IV SOLN
4.0000 mg | Freq: Once | INTRAVENOUS | Status: AC
  Administered 2019-01-16: 4 mg via INTRAVENOUS
  Filled 2019-01-16: qty 1

## 2019-01-16 NOTE — Progress Notes (Signed)
Patient transferred to vital lift bed via hoyer lift. Patient tolerated well. Pain addressed. Patient in bed resting at this time. No further complaints. Will continue to monitor patient.

## 2019-01-16 NOTE — Consult Note (Signed)
Chief Complaint: Patient was seen in consultation today for Thoracic 6 Vertebroplasty Chief Complaint  Patient presents with   Chest Pain   Fall   at the request of Dr Kirby Crigler  Supervising Physician: Julieanne Cotton  Patient Status: [facility] - In-pt  History of Present Illness: William Peterson is a 72 y.o. male   Pt has had chronic back pain most of his life after injury at 3 yr of age Noted onset new pain higher in back after fall at home recently  CT yesterday:  IMPRESSION: MR THORACIC SPINE IMPRESSION 1. Acute severe compression fracture of T6. Tiny broad-based disc protrusion at T6-7 without neural impingement. Slight soft tissue injury to the right of midline in the posterior paraspinal soft tissues at C6-7. 2. No other significant abnormality of the thoracic spine.  Hx: CHF/COPD exacerbation - in [facility] 3/30-4/3 Covid neg 3/30 Dc home 4/3 Larey Seat at home backward onto toilet 4/11  Now in hospital for back pain: imaging revealing T6 acute fracture Also treating for PNA/leukocytosis  Started Cefipime 4/12 ( to continue 5-7 days per MD) Wbc still 17.9    Taking Tramadol and Oxy for pain Still pain is 10/10 when wears off Unable to move in bed or get around without help-- or without pain  Dr Jonathon Bellows called Dr Lowella Dandy yesterday to review imaging and consult for T6 VP Dr Lowella Dandy did note disc protrusion at same site--- asked for Neuro consult Per MD-- "Neuro called him and said there would be no reason not to move ahead with vertebroplasty")  Due to location of fracture-- Dr Corliss Skains was consulted I have seen and evaluated pt He definitely has pain in T6 area I have spoken to Dr Jonathon Bellows:  Must have completed antibiotic and CXR must be repeated and clear Dr Jonathon Bellows understands our needs-- agreeable He will re evaluate pt after antibiotic  He will repeat CXR  He will place order if symptoms of pain persists   Past Medical History:  Diagnosis Date   CHF (congestive heart  failure) (HCC)    COPD (chronic obstructive pulmonary disease) (HCC)    2L O2 at baseline   HTN (hypertension)    RA (rheumatoid arthritis) (HCC)    Not on immunosuppresants currently it looks like    Past Surgical History:  Procedure Laterality Date   LEFT HEART CATH      Allergies: Naproxen sodium  Medications: Prior to Admission medications   Medication Sig Start Date End Date Taking? Authorizing Provider  albuterol (PROVENTIL HFA;VENTOLIN HFA) 108 (90 Base) MCG/ACT inhaler Inhale 2 puffs into the lungs every 6 (six) hours as needed for wheezing or shortness of breath.   Yes [provider]  albuterol (PROVENTIL) (2.5 MG/3ML) 0.083% nebulizer solution Take 2.5 mg by nebulization every 6 (six) hours as needed for wheezing or shortness of breath.   Yes [provider]  atorvastatin (LIPITOR) 10 MG tablet Take 10 mg by mouth daily. 08/04/17  Yes [provider]  benzonatate (TESSALON) 100 MG capsule Take 1 capsule (100 mg total) by mouth 3 (three) times daily. 01/05/19  Yes Purohit, Salli Quarry, MD  carvedilol (COREG) 3.125 MG tablet Take 3.125 mg by mouth 2 (two) times daily. 02/17/18  Yes [provider]  dextromethorphan-guaiFENesin (MUCINEX DM) 30-600 MG 12hr tablet Take 1 tablet by mouth 2 (two) times daily for 30 days. 01/05/19 02/04/19 Yes Purohit, Salli Quarry, MD  furosemide (LASIX) 20 MG tablet Take 40 mg by mouth daily.  Yes [provider]  mometasone-formoterol (DULERA) 200-5 MCG/ACT AERO Inhale 2 puffs into the lungs 2 (two) times daily for 30 days. 01/05/19 02/04/19 Yes Purohit, Salli QuarryShrey C, MD     Family History  Problem Relation Age of Onset   Heart disease Father    Diabetes Brother    Cancer Maternal Grandmother    Hypertension Neg Hx    Stroke Neg Hx     Social History   Socioeconomic History   Marital status: Married    Spouse name: Not on file   Number of children: Not on file   Years of education: Not on file    Highest education level: Not on file  Occupational History   Not on file  Social Needs   Financial resource strain: Not on file   Food insecurity:    Worry: Not on file    Inability: Not on file   Transportation needs:    Medical: Not on file    Non-medical: Not on file  Tobacco Use   Smoking status: Former Smoker    Types: Cigarettes   Smokeless tobacco: Never Used  Substance and Sexual Activity   Alcohol use: Yes    Comment: "very little"   Drug use: Never   Sexual activity: Not on file  Lifestyle   Physical activity:    Days per week: Not on file    Minutes per session: Not on file   Stress: Not on file  Relationships   Social connections:    Talks on phone: Not on file    Gets together: Not on file    Attends religious service: Not on file    Active member of club or organization: Not on file    Attends meetings of clubs or organizations: Not on file    Relationship status: Not on file  Other Topics Concern   Not on file  Social History Narrative   Not on file    Review of Systems: A 12 point ROS discussed and pertinent positives are indicated in the HPI above.  All other systems are negative.  Review of Systems  Constitutional: Positive for activity change. Negative for fatigue and fever.  Respiratory: Positive for cough. Negative for shortness of breath.   Musculoskeletal: Positive for back pain and gait problem.  Neurological: Positive for weakness.  Psychiatric/Behavioral: Negative for behavioral problems and confusion.    Vital Signs: BP (!) 101/55 (BP Location: Right Arm)    Pulse 65    Temp (!) 97.5 F (36.4 C) (Oral)    Resp (!) 24    Ht 5\' 10"  (1.778 m)    Wt (!) 361 lb (163.7 kg) Comment: a scale   SpO2 95%    BMI 51.80 kg/m   Physical Exam Vitals signs reviewed.  Cardiovascular:     Rate and Rhythm: Normal rate and regular rhythm.  Pulmonary:     Breath sounds: Wheezing present.  Musculoskeletal: Normal range of motion.      Comments: Helped pt roll to left Palpation to T6 area has definite pain ellicitation  Skin:    Findings: Ecchymosis present.  Neurological:     Mental Status: He is alert and oriented to person, place, and time.  Psychiatric:        Behavior: Behavior normal.     Imaging: Dg Chest 2 View  Result Date: 01/14/2019 CLINICAL DATA:  Dyspnea EXAM: CHEST - 2 VIEW COMPARISON:  01/01/2019 chest radiograph. FINDINGS: Low lung volumes. Stable cardiomediastinal silhouette with mild  cardiomegaly. No pneumothorax. Patchy opacity at both lung bases. No overt pulmonary edema. IMPRESSION: Low lung volumes. Patchy bibasilar lung opacities, differential includes aspiration, pneumonia or atelectasis. Chest radiograph follow-up advised. Electronically Signed   By: Delbert Phenix M.D.   On: 01/14/2019 12:24   Dg Lumbar Spine Complete  Result Date: 01/14/2019 CLINICAL DATA:  Fall last night.  Back and left hip pain. EXAM: LUMBAR SPINE - COMPLETE 4+ VIEW COMPARISON:  Lumbar spine CT 09/18/2008. FINDINGS: There are 5 lumbar type vertebral bodies. The bones are mildly demineralized. No evidence of acute fracture or pars defect. The disc spaces are preserved. Moderate facet degenerative changes are present inferiorly. There are bilateral sacroiliac degenerative changes. There is diffuse aortic atherosclerosis. IMPRESSION: No evidence of acute lumbar spine injury. Facet arthropathy and aortic atherosclerosis noted. Electronically Signed   By: Carey Bullocks M.D.   On: 01/14/2019 12:30   Dg Knee 1-2 Views Left  Result Date: 01/15/2019 CLINICAL DATA:  PAIN AND SWELLING POST FALL EXAM: LEFT KNEE - 1-2 VIEW COMPARISON:  None. FINDINGS: Marginal spurs about all 3 compartments of the knee. Mild narrowing of the articular cartilage in the medial compartment. Negative for fracture or dislocation. No definite effusion. Normal alignment. Patchy popliteal arterial calcifications. IMPRESSION: 1. Negative for fracture or other acute  bone abnormality. 2. Tricompartmental degenerative change, most marked medially. Electronically Signed   By: Corlis Leak M.D.   On: 01/15/2019 11:30   Ct Head Wo Contrast  Result Date: 01/14/2019 CLINICAL DATA:  Head trauma, headache EXAM: CT HEAD WITHOUT CONTRAST CT CERVICAL SPINE WITHOUT CONTRAST TECHNIQUE: Multidetector CT imaging of the head and cervical spine was performed following the standard protocol without intravenous contrast. Multiplanar CT image reconstructions of the cervical spine were also generated. COMPARISON:  None. FINDINGS: CT HEAD FINDINGS Brain: Generalized atrophy without hydrocephalus. Diffuse white matter hypodensity bilaterally appears chronic. Negative for acute infarct, hemorrhage, or mass. No midline shift. Vascular: Negative for hyperdense vessel Skull: Negative Sinuses/Orbits: Mucosal edema paranasal sinuses. Bilateral cataract surgery. Other: None CT CERVICAL SPINE FINDINGS Alignment: Normal alignment Image quality degraded by patient motion. Skull base and vertebrae: Negative for fracture Soft tissues and spinal canal: Atherosclerotic calcification in the carotid artery bilaterally. Negative for soft tissue mass Disc levels: Mild disc and facet degeneration in the cervical spine. Upper chest: Lung apices clear bilaterally. Other: None IMPRESSION: 1. No acute intracranial abnormality. Atrophy with chronic white matter changes 2. Negative for cervical spine fracture.  Mild degenerative changes. Electronically Signed   By: Marlan Palau M.D.   On: 01/14/2019 14:05   Ct Chest Wo Contrast  Result Date: 01/14/2019 CLINICAL DATA:  Right chest pain since falling last night. History of CHF, COPD and rheumatoid arthritis. EXAM: CT CHEST WITHOUT CONTRAST TECHNIQUE: Multidetector CT imaging of the chest was performed following the standard protocol without IV contrast. COMPARISON:  Chest radiographs today and 01/01/2019. Report only from chest CT 06/10/2005. FINDINGS: Cardiovascular:  Moderate atherosclerosis of the aorta, great vessels and coronary arteries. No acute vascular findings on noncontrast imaging. The heart size is normal. There is no pericardial effusion. Mediastinum/Nodes: There are no enlarged mediastinal, hilar or axillary lymph nodes.Hilar assessment is limited by the lack of intravenous contrast. The thyroid gland, trachea and esophagus demonstrate no significant findings. Lungs/Pleura: There is no pleural effusion or pneumothorax. Mild centrilobular emphysema is present. There are patchy airspace opacities at both lung bases which are likely inflammatory. There is no consolidation or suspicious pulmonary nodule. There is a 6 mm  perifissural nodule along the right middle lobe on image 69/5 which is likely a lymph node. Upper abdomen: Diffuse contour irregularity of the liver suspicious for cirrhosis. The liver also demonstrates decreased density consistent with steatosis. No focal lesion identified on noncontrast imaging. There are multiple layering calcified gallstones. There is a probable cyst posteriorly in the mid left kidney, incompletely visualized. Musculoskeletal/Chest wall: There are anterior compression deformities at T6 and T7 which are age indeterminate, not previously imaged. The T6 fracture results in greater than 50% loss vertebral body height, and there may be mild paraspinal hemorrhage at this level suggesting acuity. IMPRESSION: 1. Patchy bibasilar airspace opacities, consistent with pneumonia, possibly on the basis of aspiration. Followup PA and lateral chest X-ray is recommended in 3-4 weeks following trial of antibiotic therapy to ensure resolution and exclude underlying malignancy. 2. Age-indeterminate compression fractures at T6 and T7. There may be mild paraspinal hemorrhage associated with the T6 fracture, suggesting possible acuity. 3. Aortic Atherosclerosis (ICD10-I70.0) and Emphysema (ICD10-J43.9). 4. Contour irregularity of the liver suspicious for  cirrhosis. Cholelithiasis. Electronically Signed   By: Carey Bullocks M.D.   On: 01/14/2019 14:14   Ct Cervical Spine Wo Contrast  Result Date: 01/14/2019 CLINICAL DATA:  Head trauma, headache EXAM: CT HEAD WITHOUT CONTRAST CT CERVICAL SPINE WITHOUT CONTRAST TECHNIQUE: Multidetector CT imaging of the head and cervical spine was performed following the standard protocol without intravenous contrast. Multiplanar CT image reconstructions of the cervical spine were also generated. COMPARISON:  None. FINDINGS: CT HEAD FINDINGS Brain: Generalized atrophy without hydrocephalus. Diffuse white matter hypodensity bilaterally appears chronic. Negative for acute infarct, hemorrhage, or mass. No midline shift. Vascular: Negative for hyperdense vessel Skull: Negative Sinuses/Orbits: Mucosal edema paranasal sinuses. Bilateral cataract surgery. Other: None CT CERVICAL SPINE FINDINGS Alignment: Normal alignment Image quality degraded by patient motion. Skull base and vertebrae: Negative for fracture Soft tissues and spinal canal: Atherosclerotic calcification in the carotid artery bilaterally. Negative for soft tissue mass Disc levels: Mild disc and facet degeneration in the cervical spine. Upper chest: Lung apices clear bilaterally. Other: None IMPRESSION: 1. No acute intracranial abnormality. Atrophy with chronic white matter changes 2. Negative for cervical spine fracture.  Mild degenerative changes. Electronically Signed   By: Marlan Palau M.D.   On: 01/14/2019 14:05   Mr Thoracic Spine Wo Contrast  Result Date: 01/15/2019 CLINICAL DATA:  Mid back pain after a fall last night. Compression fracture of T6. EXAM: MRI THORACIC AND LUMBAR SPINE WITHOUT CONTRAST TECHNIQUE: Multiplanar and multiecho pulse sequences of the thoracic and lumbar spine were obtained without intravenous contrast. COMPARISON:  None. Report of thoracic MRI dated 01/15/2006 FINDINGS: MRI THORACIC SPINE FINDINGS Alignment:  Slight accentuation of the  thoracic kyphosis. Vertebrae: Is no acute severe compression fracture of T6. Tiny disc protrusion at T6-7 into the spinal canal touching the ventral aspect of the spinal cord but without spinal cord compression or myelopathy. The fracture of T6 involves the base of both pedicles. No significant paraspinal hematoma. The other thoracic vertebra demonstrate no significant abnormalities. Cord:  No mass lesion or myelopathy.  No spinal cord compression. Paraspinal and other soft tissues: There is slight edema in posterior paraspinal musculature just to the right of midline at T6-7 consistent with soft tissue injury secondary to flexion injury. Disc levels: The discs throughout the thoracic spine demonstrate no significant abnormality other than a small broad-based disc protrusion at T6-7. MRI LUMBAR SPINE FINDINGS Segmentation:  Standard. Alignment:  Physiologic. Vertebrae: Tiny hemangiomata in the inferior aspects  of the T12 and L1 vertebral bodies. No significant bone abnormality. Conus medullaris and cauda equina: Conus extends to the T12-L1 level. Conus and cauda equina appear normal. Paraspinal and other soft tissues: Multiple bilateral renal cysts, left more than right. Otherwise negative. Disc levels: L1-2 and L2-3: Negative. L3-4: Tiny broad-based disc bulge without neural impingement. Minimal degenerative changes of the facet joints. L4-5: Tiny annular tear at the level of the left neural foramen. No disc bulging or protrusion. Moderate bilateral facet arthritis. No neural impingement. L5-S1: Tiny broad-based disc bulge with no neural impingement. Slight degenerative changes of the facet joints. IMPRESSION: MR THORACIC SPINE IMPRESSION 1. Acute severe compression fracture of T6. Tiny broad-based disc protrusion at T6-7 without neural impingement. Slight soft tissue injury to the right of midline in the posterior paraspinal soft tissues at C6-7. 2. No other significant abnormality of the thoracic spine. MR LUMBAR  SPINE IMPRESSION No acute or significant abnormality of the lumbar spine. Electronically Signed   By: Francene Boyers M.D.   On: 01/15/2019 15:22   Mr Lumbar Spine Wo Contrast  Result Date: 01/15/2019 CLINICAL DATA:  Mid back pain after a fall last night. Compression fracture of T6. EXAM: MRI THORACIC AND LUMBAR SPINE WITHOUT CONTRAST TECHNIQUE: Multiplanar and multiecho pulse sequences of the thoracic and lumbar spine were obtained without intravenous contrast. COMPARISON:  None. Report of thoracic MRI dated 01/15/2006 FINDINGS: MRI THORACIC SPINE FINDINGS Alignment:  Slight accentuation of the thoracic kyphosis. Vertebrae: Is no acute severe compression fracture of T6. Tiny disc protrusion at T6-7 into the spinal canal touching the ventral aspect of the spinal cord but without spinal cord compression or myelopathy. The fracture of T6 involves the base of both pedicles. No significant paraspinal hematoma. The other thoracic vertebra demonstrate no significant abnormalities. Cord:  No mass lesion or myelopathy.  No spinal cord compression. Paraspinal and other soft tissues: There is slight edema in posterior paraspinal musculature just to the right of midline at T6-7 consistent with soft tissue injury secondary to flexion injury. Disc levels: The discs throughout the thoracic spine demonstrate no significant abnormality other than a small broad-based disc protrusion at T6-7. MRI LUMBAR SPINE FINDINGS Segmentation:  Standard. Alignment:  Physiologic. Vertebrae: Tiny hemangiomata in the inferior aspects of the T12 and L1 vertebral bodies. No significant bone abnormality. Conus medullaris and cauda equina: Conus extends to the T12-L1 level. Conus and cauda equina appear normal. Paraspinal and other soft tissues: Multiple bilateral renal cysts, left more than right. Otherwise negative. Disc levels: L1-2 and L2-3: Negative. L3-4: Tiny broad-based disc bulge without neural impingement. Minimal degenerative changes of  the facet joints. L4-5: Tiny annular tear at the level of the left neural foramen. No disc bulging or protrusion. Moderate bilateral facet arthritis. No neural impingement. L5-S1: Tiny broad-based disc bulge with no neural impingement. Slight degenerative changes of the facet joints. IMPRESSION: MR THORACIC SPINE IMPRESSION 1. Acute severe compression fracture of T6. Tiny broad-based disc protrusion at T6-7 without neural impingement. Slight soft tissue injury to the right of midline in the posterior paraspinal soft tissues at C6-7. 2. No other significant abnormality of the thoracic spine. MR LUMBAR SPINE IMPRESSION No acute or significant abnormality of the lumbar spine. Electronically Signed   By: Francene Boyers M.D.   On: 01/15/2019 15:22   Mr Hip Left Wo Contrast  Result Date: 01/15/2019 CLINICAL DATA:  Left hip pain after a fall at home last night. EXAM: MR OF THE LEFT HIP WITHOUT CONTRAST TECHNIQUE: Multiplanar,  multisequence MR imaging was performed. No intravenous contrast was administered. COMPARISON:  Radiographs dated 01/14/2019 FINDINGS: Bones: There is no fracture or dislocation or significant arthritis of the hips. Pelvic bones are intact. Sacroiliac joints are normal. Articular cartilage and labrum Articular cartilage:  Normal. Labrum:  Intact. Joint or bursal effusion Joint effusion:  Minimal left hip effusion. Bursae: No bursitis. Muscles and tendons Muscles and tendons: There is extensive edema in the left inferior adductor muscles as well as in the inferior aspects of left gluteus maximus muscles as well as in the left hamstring muscles. Hamstring tendons are intact. There is also slight edema in vastus medialis muscle of the left thigh. There is also slight edema in the left gluteus medius muscle. Other findings Miscellaneous:   None IMPRESSION: Extensive muscle strains in the left buttock and left hip and thigh. There are less prominent strains of the right gluteus maximus muscle and  adductor muscles No fractures of the left hip or pelvic bones. Electronically Signed   By: Francene Boyers M.D.   On: 01/15/2019 15:28   Vas [place] Vanice Sarah With/wo Tbi  Result Date: 01/15/2019 LOWER EXTREMITY DOPPLER STUDY Indications: Cold, dusky feet.  Performing Technologist: Olen Cordial Rvt  Examination Guidelines: A complete evaluation includes at minimum, Doppler waveform signals and systolic blood pressure reading at the level of bilateral brachial, anterior tibial, and posterior tibial arteries, when vessel segments are accessible. Bilateral testing is considered an integral part of a complete examination. Photoelectric Plethysmograph (PPG) waveforms and toe systolic pressure readings are included as required and additional duplex testing as needed. Limited examinations for reoccurring indications may be performed as noted.  ABI Findings: +--------+------------------+-----+---------+--------+  Right    Rt Pressure (mmHg) Index Waveform  Comment   +--------+------------------+-----+---------+--------+  Brachial 88                       triphasic           +--------+------------------+-----+---------+--------+  PTA      87                 0.99  biphasic            +--------+------------------+-----+---------+--------+  DP       70                 0.80  biphasic            +--------+------------------+-----+---------+--------+ +--------+------------------+-----+--------+-----------------------+  Left     Lt Pressure (mmHg) Index Waveform Comment                  +--------+------------------+-----+--------+-----------------------+  Brachial                                   Pt guarding/positioning  +--------+------------------+-----+--------+-----------------------+  PTA      87                 0.99  biphasic                          +--------+------------------+-----+--------+-----------------------+  DP       59                 0.67  biphasic                           +--------+------------------+-----+--------+-----------------------+ +-------+-----------+-----------+------------+------------+  ABI/TBI Today's  ABI Today's TBI Previous ABI Previous TBI  +-------+-----------+-----------+------------+------------+  Right   0.99                                               +-------+-----------+-----------+------------+------------+  Left    0.99                                               +-------+-----------+-----------+------------+------------+  Summary: Right: Resting right ankle-brachial index is within normal range. No evidence of significant right lower extremity arterial disease. Unable to obtain TBI due to patient movement. Left: Resting left ankle-brachial index is within normal range. No evidence of significant left lower extremity arterial disease. Unable to obtain TBI due to patient movement.  *See table(s) above for measurements and observations.  Electronically signed by Sherald Hess MD on 01/15/2019 at 5:18:27 PM.   Final    Dg Hip Unilat W Or Wo Pelvis 2-3 Views Left  Result Date: 01/14/2019 CLINICAL DATA:  Fall last night.  Back and left hip pain. EXAM: DG HIP (WITH OR WITHOUT PELVIS) 2-3V LEFT COMPARISON:  None. FINDINGS: Examination is limited by body habitus and positioning. The bones appear demineralized. No definite evidence of acute fracture or dislocation. However, the left femoral neck is suboptimally visualized. Mild degenerative changes of the hips, sacroiliac joints and lower lumbar spine. IMPRESSION: No definite acute osseous findings. Evaluation of the left hip is limited by body habitus and positioning. Consider CT for more definitive evaluation. Electronically Signed   By: Carey Bullocks M.D.   On: 01/14/2019 12:28    Labs:  CBC: Recent Labs    01/04/19 0324 01/14/19 1058 01/15/19 0508 01/16/19 0722  WBC 11.5* 24.0* 18.3* 17.9*  HGB 12.6* 13.4 12.7* 13.0  HCT 42.2 45.4 41.1 41.9  PLT 301 291 207 208    COAGS: No results  for input(s): INR, APTT in the last 8760 hours.  BMP: Recent Labs    01/04/19 0324 01/14/19 1058 01/15/19 0508 01/16/19 0722  NA 135 141 141 141  K 4.3 5.4* 5.1 4.6  CL 99 102 104 104  CO2 GLUCOSE 96 85 65* 71  BUN 29* 37* 55* 58*  CALCIUM 8.3* 8.5* 7.9* 8.1*  CREATININE 0.83 2.51* 2.75* 2.16*  GFRNONAA >60 25* 22* 30*  GFRAA >60 29* 26* 34*    LIVER FUNCTION TESTS: Recent Labs    01/04/19 0324 01/14/19 1058 01/15/19 0508 01/16/19 0722  BILITOT 0.4 2.3* 1.3* 1.2  AST 19 209* 244* 193*  ALT 14 38 39 41  ALKPHOS 48 59 63 62  PROT 5.9* 5.9* 4.6* 5.1*  ALBUMIN 2.6* 2.8* 2.0* 2.1*    TUMOR MARKERS: No results for input(s): AFPTM, CEA, CA199, CHROMGRNA in the last 8760 hours.  Assessment and Plan:  Severe back pain post fall at home 4/11 Imaging showing new T6 fracture Request for T6 vertebroplasty Reviewed with Dr Corliss Skains- would consider procedure at later date -- Recommend: 1.completing antibiotics for active PNA;  2. Repeat CXR after completion to be sure PNA is clear 3. Place order for vertebroplasty If back pain symptoms persist. Dr Jonathon Bellows aware of plan and agreeable  Thank you for this interesting consult.  I greatly enjoyed meeting Tevan L  Monestime and look forward to participating in their care.  A copy of this report was sent to the requesting provider on this date.  Electronically Signed: Robet LeuPamela A Shawne Bulow, PA-C 01/16/2019, 9:57 AM   I spent a total of 40 Minutes    in face to face in clinical consultation, greater than 50% of which was counseling/coordinating care for T6 VP

## 2019-01-16 NOTE — Progress Notes (Addendum)
PROGRESS NOTE    William Peterson  BOF:[id number] DOB: 03/20/1946 DOA: 01/14/2019 PCP: Doreen Salvage, PA-C   Brief Narrative: 73 year old male with COPD chronic respiratory failure on 2 L nasal cannula, HTN, CHF (D), recently admitted @ [facility] 3/30-4/3 for COPD exacerbation, COVID 19 NEGATIVE and was discharged home, brought from home after at fall 10 PM 01/13/2019 in the toilet, fell backward was unable to get up.  Was also complaining of right-sided chest pain worse with palpation, in the ER found to have AKI with creatinine 2.5 elevated LFTs, CK of 10,000, troponin 0 0.07, lactic acid 5.2 WBC 20 4K chest x-ray with patchy bibasilar airspace opacities consistent with pneumonia, age indeterminate compression fracture at T6-T7 and was admitted. I called his sister 4/13 who states patient has been dealing with leg swelling for 3 months and chronic back pain worse in the past 2 weeks and overall he has not been doing well. Patient's MRI showed acute severe compression fracture of T6, severe muscle strain on the left hip.  Patient continues to have AKI slightly improving,pain on the left hip and back, and remains on broad-spectrum antibiotics for pneumonia.    Subjective: Laying flat in bed, alert awake, complains of pain which is on his left hip and mid back.  Remains on home oxygen setting.  Overnight no fevers nausea vomiting.  Assessment & Plan:  HCAP: with leukocytosis, abnormal chest x-ray/CT. WBC downtrending, of note was also on recent steroids from last discharge.  Had lactic acidosis on admission-sepsis versus related to recent steroids/abnormalities. Suspect from infection. Overall hemodynamically stable, afebrile, wbc and lactate downtrending. Continue empiric antibiotics Vanco/cefepime w close monitoring due to AKI, MRSA screen negative, blood culture NGTD. Repeat CXR in am.  Recent Labs  Lab 01/14/19 1058 01/15/19 0508 01/16/19 0722  WBC 24.0* 18.3* 17.9*   Chronic diastolic CHF w  fluid overload leg swelling got 3 months /HTN : On Lasix at home, bnp at 128 ( but obese), has significant lower extremity edema and got lasix. repeat cxr in am to assess.  Appreciate nephrology assistance for diuretic management  Fall in bathroom at home: Work-up with CT head/neck, Xray  lumbar spine and left hip head, neck/cervical spine, lumbar x-ray with no acute finding.  X-ray of the knee shows osteoarthritis.  Had MRI T/L spine and left hip-showing severe compression fracture of T6 . Cont pain control. PT/OT as able.  Acute on Chronic back pain pain for 3 months or longer but worse in 2 weeks, no priro fall as per sister. Mri w T6 severe compression fracture. Cont pain control oxy and tramadol prn.  I had discussed with oncall [place] neurosurgon 4/13 and spoke over phone, he reviewed MRI of back- does not recommend any neuurosurgical intervention and okay for IR If they decide to do kyphoplasty.Discussed with IR who is following, given his current pneumonian holding off on kyphoplasty until infection clears off.  AKI, baseline creatinine around 0.8, and now in 2.7.Suspect multifactorial in the setting of pneumonia, rhabdomyolysis, CHF possible hypotension.  Blood pressure soft. has Foley catheter placed 4/13 and nephrology on board, creat downtrending, got a dose of Lasix and on ivf overnight.  Does have a bilateral lower extremity edema- defer diuretics/fluid management per nephrology.  Recent Labs  Lab 01/14/19 1058 01/15/19 0508 01/16/19 0722  CREATININE 2.51* 2.75* 2.16*   Rhabdomyolysis Traumatic: CK 10,k-->3k.Hold home lipitor.off ivf. Trend CK.  Transaminitis:w Elevated isolated AST , likely in the setting of rhabdomyolysis.  Monitor.  Elevated troponin,  mildly + and flat: Suspect this is secondary to fall/rhabdo.  He had right-sided chest pain, no overt ischemic changes on the EKG. Has reproducible right-sided chest pain likely musculoskeletal.TTE w lvef 55-60% and normal LV and  RV size and fun. Recent Labs  Lab 01/14/19 1058 01/14/19 1849 01/14/19 2356 01/15/19 0508  TROPONINI 0.07* 0.08* 0.05* 0.05*   COPD with chronic hypoxic respiratory failure on 2 L nasal cannula: On home oxygen, stable, continue home dulera, albuterol nebs  Morbid obesity with BMI 50: Weight loss and healthy lifestyle would be beneficial but not sure if this is feasible given his age and  complex comorbidities.  HLD-hold lipitor  Suspected bilateral PVD: normal b/l LE ABI   DVT prophylaxis: SQ Heparin Code Status: Full code Family Communication: Discussed finding with the patient and plan of care. Updated pt's contact- sister over the phone and discussed pain management- she is requesting more pain meds as pt c/o ongoing severe pain to her. Added iv dilaudid and increases oxy to 10 mg. We discussed that given his pneumonia and aki we need to be judicious with pain meds.  Disposition Plan: remains inpatient pending clinical improvement.  Consultants:  Nephrology  Procedures:  MRI left hip  extensive muscle strains in the left buttock and left hip and thigh. There are less prominent strains of the right gluteus maximus muscle and adductor muscles  MRI thoracic and lumbar spine  Acute severe compression fracture of T6. Tiny broad-based disc protrusion at T6-7 without neural impingement. Slight soft tissue injury to the right of midline in the posterior paraspinal soft tissues at C6-7.  Antimicrobials: Anti-infectives (From admission, onward)   Start     Dose/Rate Route Frequency Ordered Stop   01/15/19 1500  vancomycin (VANCOCIN) 1,750 mg in sodium chloride 0.9 % 500 mL IVPB     1,750 mg 250 mL/hr over 120 Minutes Intravenous Every 24 hours 01/15/19 0929     01/14/19 2200  piperacillin-tazobactam (ZOSYN) IVPB 3.375 g  Status:  Discontinued     3.375 g 12.5 mL/hr over 240 Minutes Intravenous Every 8 hours 01/14/19 1412 01/14/19 1544   01/14/19 1800  ceFEPIme (MAXIPIME) 2  g in sodium chloride 0.9 % 100 mL IVPB     2 g 200 mL/hr over 30 Minutes Intravenous Every 24 hours 01/14/19 1546     01/14/19 1415  piperacillin-tazobactam (ZOSYN) IVPB 3.375 g     3.375 g 100 mL/hr over 30 Minutes Intravenous  Once 01/14/19 1412 01/14/19 1510   01/14/19 1415  vancomycin (VANCOCIN) 2,000 mg in sodium chloride 0.9 % 500 mL IVPB     2,000 mg 250 mL/hr over 120 Minutes Intravenous  Once 01/14/19 1412 01/14/19 1737   01/14/19 1410  vancomycin variable dose per unstable renal function (pharmacist dosing)  Status:  Discontinued      Does not apply See admin instructions 01/14/19 1412 01/15/19 0929       Objective: Vitals:   01/16/19 0004 01/16/19 0114 01/16/19 0438 01/16/19 0907  BP: (!) 110/41  (!) 101/55   Pulse: 73  65   Resp: (!) 24  (!) 24   Temp: 98 F (36.7 C)  (!) 97.5 F (36.4 C)   TempSrc: Oral  Oral   SpO2: 92%  96% 95%  Weight:  (!) 163.7 kg    Height:        Intake/Output Summary (Last 24 hours) at 01/16/2019 1007 Last data filed at 01/16/2019 0940 Gross per 24 hour  Intake 2915.72 ml  Output 3650 ml  Net -734.28 ml   Filed Weights   01/14/19 1825 01/15/19 0120 01/16/19 0114  Weight: (!) 159.7 kg (!) 161 kg (!) 163.7 kg   Weight change: 9.072 kg  Body mass index is 51.8 kg/m.  Intake/Output from previous day: 04/13 0701 - 04/14 0700 In: 2675.7 [P.O.:1080; I.V.:995.7; IV Piggyback:600] Out: 3150 [Urine:3150] Intake/Output this shift: Total I/O In: 240 [P.O.:240] Out: 500 [Urine:500]  Examination: General exam: Obese, alert awake , in pain, on [place] 02. HEENT:PERRL,Oral mucosa moist, Ear/Nose normal on gross exam Respiratory system: b/l diminished breath sounds, no use of accessory muscle  Cardiovascular system: S1 & S2+,No JVD, murmurs. Gastrointestinal system: Abdomen is soft, obese, non tender, non distended, BS +  Nervous System:Alert,awake and oriented. Difficulty moving left leg due to pain Extremities: b/l LE edema+ extensively.  Tender left knees and left hip Skin: No rashes, lesions, no icterus MSK: Normal muscle bulk,tone ,power  Medications:  Scheduled Meds:  Gerhardt's butt cream   Topical QID   heparin  5,000 Units Subcutaneous Q8H   mometasone-formoterol  2 puff Inhalation BID   sodium chloride flush  3 mL Intravenous Q12H   Continuous Infusions:  sodium chloride Stopped (01/15/19 1852)   ceFEPime (MAXIPIME) IV 2 g (01/15/19 1804)   vancomycin 250 mL/hr at 01/15/19 1907   Data Reviewed: I have personally reviewed following labs and imaging studies  CBC: Recent Labs  Lab 01/14/19 1058 01/15/19 0508 01/16/19 0722  WBC 24.0* 18.3* 17.9*  NEUTROABS 22.6*  --   --   HGB 13.4 12.7* 13.0  HCT 45.4 41.1 41.9  MCV 95.8 94.9 95.9  PLT 291 207 208   Basic Metabolic Panel: Recent Labs  Lab 01/14/19 1058 01/15/19 0508 01/16/19 0722  NA 141 141 141  K 5.4* 5.1 4.6  CL 102 104 104  CO2 23 22 25   GLUCOSE 85 65* 71  BUN 37* 55* 58*  CREATININE 2.51* 2.75* 2.16*  CALCIUM 8.5* 7.9* 8.1*   GFR: Estimated Creatinine Clearance: 48.5 mL/min (A) (by C-G formula based on SCr of 2.16 mg/dL (H)). Liver Function Tests: Recent Labs  Lab 01/14/19 1058 01/15/19 0508 01/16/19 0722  AST 209* 244* 193*  ALT 38 39 41  ALKPHOS 59 63 62  BILITOT 2.3* 1.3* 1.2  PROT 5.9* 4.6* 5.1*  ALBUMIN 2.8* 2.0* 2.1*   No results for input(s): LIPASE, AMYLASE in the last 168 hours. No results for input(s): AMMONIA in the last 168 hours. Coagulation Profile: No results for input(s): INR, PROTIME in the last 168 hours. Cardiac Enzymes: Recent Labs  Lab 01/14/19 1058 01/14/19 1849 01/14/19 2356 01/15/19 0508 01/15/19 0816 01/15/19 1924 01/16/19 0722  CKTOTAL 10,002*  --   --   --  7,273* 5,800* 3,922*  TROPONINI 0.07* 0.08* 0.05* 0.05*  --   --   --    BNP (last 3 results) No results for input(s): PROBNP in the last 8760 hours. HbA1C: No results for input(s): HGBA1C in the last 72 hours. CBG: No  results for input(s): GLUCAP in the last 168 hours. Lipid Profile: No results for input(s): CHOL, HDL, LDLCALC, TRIG, CHOLHDL, LDLDIRECT in the last 72 hours. Thyroid Function Tests: No results for input(s): TSH, T4TOTAL, FREET4, T3FREE, THYROIDAB in the last 72 hours. Anemia Panel: No results for input(s): VITAMINB12, FOLATE, FERRITIN, TIBC, IRON, RETICCTPCT in the last 72 hours. Sepsis Labs: Recent Labs  Lab 01/14/19 1115 01/14/19 1300 01/14/19 1849 01/14/19 2356  LATICACIDVEN 5.2* 4.1* 4.2* 2.6*  Recent Results (from the past 240 hour(s))  Blood culture (routine x 2)     Status: None (Preliminary result)   Collection Time: 01/14/19 11:15 AM  Result Value Ref Range Status   Specimen Description BLOOD RIGHT WRIST  Final   Special Requests   Final    BOTTLES DRAWN AEROBIC AND ANAEROBIC Blood Culture results may not be optimal due to an inadequate volume of blood received in culture bottles   Culture   Final    NO GROWTH < 24 HOURS Performed at [facility], [address] [place], [place] [address]    Report Status PENDING  Incomplete  Blood culture (routine x 2)     Status: None (Preliminary result)   Collection Time: 01/14/19  1:25 PM  Result Value Ref Range Status   Specimen Description BLOOD RIGHT WRIST  Final   Special Requests   Final    AEROBIC BOTTLE ONLY Blood Culture results may not be optimal due to an inadequate volume of blood received in culture bottles   Culture   Final    NO GROWTH < 24 HOURS Performed at [facility], [address], [place], [place] [address]    Report Status PENDING  Incomplete  MRSA PCR Screening     Status: None   Collection Time: 01/14/19  5:37 PM  Result Value Ref Range Status   MRSA by PCR NEGATIVE NEGATIVE Final    Comment:        The GeneXpert MRSA Assay (FDA approved for NASAL specimens only), is one component of a comprehensive MRSA colonization surveillance program. It is not intended to diagnose  MRSA infection nor to guide or monitor treatment for MRSA infections. Performed at [facility], [address], [place], [place] [address]   Radiology Studies: Dg Chest 2 View  Result Date: 01/14/2019 CLINICAL DATA:  Dyspnea EXAM: CHEST - 2 VIEW COMPARISON:  01/01/2019 chest radiograph. FINDINGS: Low lung volumes. Stable cardiomediastinal silhouette with mild cardiomegaly. No pneumothorax. Patchy opacity at both lung bases. No overt pulmonary edema. IMPRESSION: Low lung volumes. Patchy bibasilar lung opacities, differential includes aspiration, pneumonia or atelectasis. Chest radiograph follow-up advised. Electronically Signed   By: Delbert Phenix M.D.   On: 01/14/2019 12:24   Dg Lumbar Spine Complete  Result Date: 01/14/2019 CLINICAL DATA:  Fall last night.  Back and left hip pain. EXAM: LUMBAR SPINE - COMPLETE 4+ VIEW COMPARISON:  Lumbar spine CT 09/18/2008. FINDINGS: There are 5 lumbar type vertebral bodies. The bones are mildly demineralized. No evidence of acute fracture or pars defect. The disc spaces are preserved. Moderate facet degenerative changes are present inferiorly. There are bilateral sacroiliac degenerative changes. There is diffuse aortic atherosclerosis. IMPRESSION: No evidence of acute lumbar spine injury. Facet arthropathy and aortic atherosclerosis noted. Electronically Signed   By: Carey Bullocks M.D.   On: 01/14/2019 12:30   Dg Knee 1-2 Views Left  Result Date: 01/15/2019 CLINICAL DATA:  PAIN AND SWELLING POST FALL EXAM: LEFT KNEE - 1-2 VIEW COMPARISON:  None. FINDINGS: Marginal spurs about all 3 compartments of the knee. Mild narrowing of the articular cartilage in the medial compartment. Negative for fracture or dislocation. No definite effusion. Normal alignment. Patchy popliteal arterial calcifications. IMPRESSION: 1. Negative for fracture or other acute bone abnormality. 2. Tricompartmental degenerative change, most marked medially. Electronically Signed   By: Corlis Leak M.D.   On: 01/15/2019 11:30   Ct Head Wo Contrast  Result Date: 01/14/2019 CLINICAL  DATA:  Head trauma, headache EXAM: CT HEAD WITHOUT CONTRAST CT CERVICAL SPINE WITHOUT CONTRAST TECHNIQUE: Multidetector CT imaging of the head and cervical spine was performed following the standard protocol without intravenous contrast. Multiplanar CT image reconstructions of the cervical spine were also generated. COMPARISON:  None. FINDINGS: CT HEAD FINDINGS Brain: Generalized atrophy without hydrocephalus. Diffuse white matter hypodensity bilaterally appears chronic. Negative for acute infarct, hemorrhage, or mass. No midline shift. Vascular: Negative for hyperdense vessel Skull: Negative Sinuses/Orbits: Mucosal edema paranasal sinuses. Bilateral cataract surgery. Other: None CT CERVICAL SPINE FINDINGS Alignment: Normal alignment Image quality degraded by patient motion. Skull base and vertebrae: Negative for fracture Soft tissues and spinal canal: Atherosclerotic calcification in the carotid artery bilaterally. Negative for soft tissue mass Disc levels: Mild disc and facet degeneration in the cervical spine. Upper chest: Lung apices clear bilaterally. Other: None IMPRESSION: 1. No acute intracranial abnormality. Atrophy with chronic white matter changes 2. Negative for cervical spine fracture.  Mild degenerative changes. Electronically Signed   By: Marlan Palau M.D.   On: 01/14/2019 14:05   Ct Chest Wo Contrast  Result Date: 01/14/2019 CLINICAL DATA:  Right chest pain since falling last night. History of CHF, COPD and rheumatoid arthritis. EXAM: CT CHEST WITHOUT CONTRAST TECHNIQUE: Multidetector CT imaging of the chest was performed following the standard protocol without IV contrast. COMPARISON:  Chest radiographs today and 01/01/2019. Report only from chest CT 06/10/2005. FINDINGS: Cardiovascular: Moderate atherosclerosis of the aorta, great vessels and coronary arteries. No acute vascular findings on  noncontrast imaging. The heart size is normal. There is no pericardial effusion. Mediastinum/Nodes: There are no enlarged mediastinal, hilar or axillary lymph nodes.Hilar assessment is limited by the lack of intravenous contrast. The thyroid gland, trachea and esophagus demonstrate no significant findings. Lungs/Pleura: There is no pleural effusion or pneumothorax. Mild centrilobular emphysema is present. There are patchy airspace opacities at both lung bases which are likely inflammatory. There is no consolidation or suspicious pulmonary nodule. There is a 6 mm perifissural nodule along the right middle lobe on image 69/5 which is likely a lymph node. Upper abdomen: Diffuse contour irregularity of the liver suspicious for cirrhosis. The liver also demonstrates decreased density consistent with steatosis. No focal lesion identified on noncontrast imaging. There are multiple layering calcified gallstones. There is a probable cyst posteriorly in the mid left kidney, incompletely visualized. Musculoskeletal/Chest wall: There are anterior compression deformities at T6 and T7 which are age indeterminate, not previously imaged. The T6 fracture results in greater than 50% loss vertebral body height, and there may be mild paraspinal hemorrhage at this level suggesting acuity. IMPRESSION: 1. Patchy bibasilar airspace opacities, consistent with pneumonia, possibly on the basis of aspiration. Followup PA and lateral chest X-ray is recommended in 3-4 weeks following trial of antibiotic therapy to ensure resolution and exclude underlying malignancy. 2. Age-indeterminate compression fractures at T6 and T7. There may be mild paraspinal hemorrhage associated with the T6 fracture, suggesting possible acuity. 3. Aortic Atherosclerosis (ICD10-I70.0) and Emphysema (ICD10-J43.9). 4. Contour irregularity of the liver suspicious for cirrhosis. Cholelithiasis. Electronically Signed   By: Carey Bullocks M.D.   On: 01/14/2019 14:14   Ct  Cervical Spine Wo Contrast  Result Date: 01/14/2019 CLINICAL DATA:  Head trauma, headache EXAM: CT HEAD WITHOUT CONTRAST CT CERVICAL SPINE WITHOUT CONTRAST TECHNIQUE: Multidetector CT imaging of the head and cervical spine was performed following the standard protocol without intravenous contrast. Multiplanar CT image reconstructions of the cervical spine were also generated. COMPARISON:  None. FINDINGS:  CT HEAD FINDINGS Brain: Generalized atrophy without hydrocephalus. Diffuse white matter hypodensity bilaterally appears chronic. Negative for acute infarct, hemorrhage, or mass. No midline shift. Vascular: Negative for hyperdense vessel Skull: Negative Sinuses/Orbits: Mucosal edema paranasal sinuses. Bilateral cataract surgery. Other: None CT CERVICAL SPINE FINDINGS Alignment: Normal alignment Image quality degraded by patient motion. Skull base and vertebrae: Negative for fracture Soft tissues and spinal canal: Atherosclerotic calcification in the carotid artery bilaterally. Negative for soft tissue mass Disc levels: Mild disc and facet degeneration in the cervical spine. Upper chest: Lung apices clear bilaterally. Other: None IMPRESSION: 1. No acute intracranial abnormality. Atrophy with chronic white matter changes 2. Negative for cervical spine fracture.  Mild degenerative changes. Electronically Signed   By: Marlan Palauharles  Clark M.D.   On: 01/14/2019 14:05   Mr Thoracic Spine Wo Contrast  Result Date: 01/15/2019 CLINICAL DATA:  Mid back pain after a fall last night. Compression fracture of T6. EXAM: MRI THORACIC AND LUMBAR SPINE WITHOUT CONTRAST TECHNIQUE: Multiplanar and multiecho pulse sequences of the thoracic and lumbar spine were obtained without intravenous contrast. COMPARISON:  None. Report of thoracic MRI dated 01/15/2006 FINDINGS: MRI THORACIC SPINE FINDINGS Alignment:  Slight accentuation of the thoracic kyphosis. Vertebrae: Is no acute severe compression fracture of T6. Tiny disc protrusion at T6-7  into the spinal canal touching the ventral aspect of the spinal cord but without spinal cord compression or myelopathy. The fracture of T6 involves the base of both pedicles. No significant paraspinal hematoma. The other thoracic vertebra demonstrate no significant abnormalities. Cord:  No mass lesion or myelopathy.  No spinal cord compression. Paraspinal and other soft tissues: There is slight edema in posterior paraspinal musculature just to the right of midline at T6-7 consistent with soft tissue injury secondary to flexion injury. Disc levels: The discs throughout the thoracic spine demonstrate no significant abnormality other than a small broad-based disc protrusion at T6-7. MRI LUMBAR SPINE FINDINGS Segmentation:  Standard. Alignment:  Physiologic. Vertebrae: Tiny hemangiomata in the inferior aspects of the T12 and L1 vertebral bodies. No significant bone abnormality. Conus medullaris and cauda equina: Conus extends to the T12-L1 level. Conus and cauda equina appear normal. Paraspinal and other soft tissues: Multiple bilateral renal cysts, left more than right. Otherwise negative. Disc levels: L1-2 and L2-3: Negative. L3-4: Tiny broad-based disc bulge without neural impingement. Minimal degenerative changes of the facet joints. L4-5: Tiny annular tear at the level of the left neural foramen. No disc bulging or protrusion. Moderate bilateral facet arthritis. No neural impingement. L5-S1: Tiny broad-based disc bulge with no neural impingement. Slight degenerative changes of the facet joints. IMPRESSION: MR THORACIC SPINE IMPRESSION 1. Acute severe compression fracture of T6. Tiny broad-based disc protrusion at T6-7 without neural impingement. Slight soft tissue injury to the right of midline in the posterior paraspinal soft tissues at C6-7. 2. No other significant abnormality of the thoracic spine. MR LUMBAR SPINE IMPRESSION No acute or significant abnormality of the lumbar spine. Electronically Signed   By:  Francene BoyersJames  Maxwell M.D.   On: 01/15/2019 15:22   Mr Lumbar Spine Wo Contrast  Result Date: 01/15/2019 CLINICAL DATA:  Mid back pain after a fall last night. Compression fracture of T6. EXAM: MRI THORACIC AND LUMBAR SPINE WITHOUT CONTRAST TECHNIQUE: Multiplanar and multiecho pulse sequences of the thoracic and lumbar spine were obtained without intravenous contrast. COMPARISON:  None. Report of thoracic MRI dated 01/15/2006 FINDINGS: MRI THORACIC SPINE FINDINGS Alignment:  Slight accentuation of the thoracic kyphosis. Vertebrae: Is no acute severe  compression fracture of T6. Tiny disc protrusion at T6-7 into the spinal canal touching the ventral aspect of the spinal cord but without spinal cord compression or myelopathy. The fracture of T6 involves the base of both pedicles. No significant paraspinal hematoma. The other thoracic vertebra demonstrate no significant abnormalities. Cord:  No mass lesion or myelopathy.  No spinal cord compression. Paraspinal and other soft tissues: There is slight edema in posterior paraspinal musculature just to the right of midline at T6-7 consistent with soft tissue injury secondary to flexion injury. Disc levels: The discs throughout the thoracic spine demonstrate no significant abnormality other than a small broad-based disc protrusion at T6-7. MRI LUMBAR SPINE FINDINGS Segmentation:  Standard. Alignment:  Physiologic. Vertebrae: Tiny hemangiomata in the inferior aspects of the T12 and L1 vertebral bodies. No significant bone abnormality. Conus medullaris and cauda equina: Conus extends to the T12-L1 level. Conus and cauda equina appear normal. Paraspinal and other soft tissues: Multiple bilateral renal cysts, left more than right. Otherwise negative. Disc levels: L1-2 and L2-3: Negative. L3-4: Tiny broad-based disc bulge without neural impingement. Minimal degenerative changes of the facet joints. L4-5: Tiny annular tear at the level of the left neural foramen. No disc bulging or  protrusion. Moderate bilateral facet arthritis. No neural impingement. L5-S1: Tiny broad-based disc bulge with no neural impingement. Slight degenerative changes of the facet joints. IMPRESSION: MR THORACIC SPINE IMPRESSION 1. Acute severe compression fracture of T6. Tiny broad-based disc protrusion at T6-7 without neural impingement. Slight soft tissue injury to the right of midline in the posterior paraspinal soft tissues at C6-7. 2. No other significant abnormality of the thoracic spine. MR LUMBAR SPINE IMPRESSION No acute or significant abnormality of the lumbar spine. Electronically Signed   By: Francene Boyers M.D.   On: 01/15/2019 15:22   Mr Hip Left Wo Contrast  Result Date: 01/15/2019 CLINICAL DATA:  Left hip pain after a fall at home last night. EXAM: MR OF THE LEFT HIP WITHOUT CONTRAST TECHNIQUE: Multiplanar, multisequence MR imaging was performed. No intravenous contrast was administered. COMPARISON:  Radiographs dated 01/14/2019 FINDINGS: Bones: There is no fracture or dislocation or significant arthritis of the hips. Pelvic bones are intact. Sacroiliac joints are normal. Articular cartilage and labrum Articular cartilage:  Normal. Labrum:  Intact. Joint or bursal effusion Joint effusion:  Minimal left hip effusion. Bursae: No bursitis. Muscles and tendons Muscles and tendons: There is extensive edema in the left inferior adductor muscles as well as in the inferior aspects of left gluteus maximus muscles as well as in the left hamstring muscles. Hamstring tendons are intact. There is also slight edema in vastus medialis muscle of the left thigh. There is also slight edema in the left gluteus medius muscle. Other findings Miscellaneous:   None IMPRESSION: Extensive muscle strains in the left buttock and left hip and thigh. There are less prominent strains of the right gluteus maximus muscle and adductor muscles No fractures of the left hip or pelvic bones. Electronically Signed   By: Francene Boyers  M.D.   On: 01/15/2019 15:28   Vas [place] Vanice Sarah With/wo Tbi  Result Date: 01/15/2019 LOWER EXTREMITY DOPPLER STUDY Indications: Cold, dusky feet.  Performing Technologist: Olen Cordial Rvt  Examination Guidelines: A complete evaluation includes at minimum, Doppler waveform signals and systolic blood pressure reading at the level of bilateral brachial, anterior tibial, and posterior tibial arteries, when vessel segments are accessible. Bilateral testing is considered an integral part of a complete examination. Photoelectric Plethysmograph (PPG) waveforms  and toe systolic pressure readings are included as required and additional duplex testing as needed. Limited examinations for reoccurring indications may be performed as noted.  ABI Findings: +--------+------------------+-----+---------+--------+  Right    Rt Pressure (mmHg) Index Waveform  Comment   +--------+------------------+-----+---------+--------+  Brachial 88                       triphasic           +--------+------------------+-----+---------+--------+  PTA      87                 0.99  biphasic            +--------+------------------+-----+---------+--------+  DP       70                 0.80  biphasic            +--------+------------------+-----+---------+--------+ +--------+------------------+-----+--------+-----------------------+  Left     Lt Pressure (mmHg) Index Waveform Comment                  +--------+------------------+-----+--------+-----------------------+  Brachial                                   Pt guarding/positioning  +--------+------------------+-----+--------+-----------------------+  PTA      87                 0.99  biphasic                          +--------+------------------+-----+--------+-----------------------+  DP       59                 0.67  biphasic                          +--------+------------------+-----+--------+-----------------------+ +-------+-----------+-----------+------------+------------+  ABI/TBI Today's  ABI Today's TBI Previous ABI Previous TBI  +-------+-----------+-----------+------------+------------+  Right   0.99                                               +-------+-----------+-----------+------------+------------+  Left    0.99                                               +-------+-----------+-----------+------------+------------+  Summary: Right: Resting right ankle-brachial index is within normal range. No evidence of significant right lower extremity arterial disease. Unable to obtain TBI due to patient movement. Left: Resting left ankle-brachial index is within normal range. No evidence of significant left lower extremity arterial disease. Unable to obtain TBI due to patient movement.  *See table(s) above for measurements and observations.  Electronically signed by Sherald Hess MD on 01/15/2019 at 5:18:27 PM.   Final    Dg Hip Unilat W Or Wo Pelvis 2-3 Views Left  Result Date: 01/14/2019 CLINICAL DATA:  Fall last night.  Back and left hip pain. EXAM: DG HIP (WITH OR WITHOUT PELVIS) 2-3V LEFT COMPARISON:  None. FINDINGS: Examination is limited by body habitus and positioning. The bones appear demineralized. No definite evidence of acute fracture or dislocation. However, the left femoral  neck is suboptimally visualized. Mild degenerative changes of the hips, sacroiliac joints and lower lumbar spine. IMPRESSION: No definite acute osseous findings. Evaluation of the left hip is limited by body habitus and positioning. Consider CT for more definitive evaluation. Electronically Signed   By: Carey Bullocks M.D.   On: 01/14/2019 12:28    LOS: 2 days   Time spent: More than 50% of that time was spent in counseling and/or coordination of care.  Lanae Boast, MD [facility]  01/16/2019, 10:07 AM

## 2019-01-16 NOTE — Evaluation (Signed)
Physical Therapy Evaluation Patient Details Name: William Peterson MRN: [id number] DOB: August 20, 1946 Today's Date: 01/16/2019   History of Present Illness  73 year old male with COPD chronic respiratory failure on 2 L nasal cannula, HTN, CHF (D), recently admitted @ [facility] 3/30-4/3 for COPD exacerbation, COVID 19 NEGATIVE and was discharged home, brought from home after at fall 10 PM 01/13/2019 in the toilet, fell backward was unable to get up.  Was also complaining of right-sided chest pain worse with palpation, in the ER found to have AKI with creatinine 2.5 elevated LFTs, CK of 10,000, troponin 0 0.07, lactic acid 5.2 WBC 20 4K chest x-ray with patchy bibasilar airspace opacities consistent with pneumonia, age indeterminate compression fracture at T6-T7 and was admitted.  Clinical Impression  Pt admitted with above diagnosis. Pt currently with functional limitations due to the deficits listed below (see PT Problem List). Pt mobility significantly limited by pain. Required +4 assist to roll and position in bed. Performed LE and UE there ex as pt could tolerate. Would benefit from VitalLift Go bed for chair positioning and progression of standing.  Pt will benefit from skilled PT to increase their independence and safety with mobility to allow discharge to the venue listed below.       Follow Up Recommendations SNF;Supervision/Assistance - 24 hour    Equipment Recommendations  None recommended by PT    Recommendations for Other Services OT consult     Precautions / Restrictions Precautions Precautions: Fall Restrictions Weight Bearing Restrictions: No      Mobility  Bed Mobility Overal bed mobility: Needs Assistance Bed Mobility: Rolling Rolling: Total assist;+2 for physical assistance         General bed mobility comments: rolled pt in bed both ways for repositioning with +4 assist and +4 needed to scoot pt up in bed. Pt able to assist by holding self somewhat in SL and holding rail  once in position. All mvmt very painful. Pt maintained SL long enough for BM to be cleaned up. Rib pain worsened when in SL.   Transfers                 General transfer comment: unable to tolerate  Ambulation/Gait             General Gait Details: unable  Stairs            Wheelchair Mobility    Modified Rankin (Stroke Patients Only)       Balance                                             Pertinent Vitals/Pain Pain Assessment: Faces Faces Pain Scale: Hurts worst Pain Location: T6 level wrapping around ribs to front on both sides Pain Descriptors / Indicators: Burning;Stabbing Pain Intervention(s): Limited activity within patient's tolerance;Monitored during session    Home Living Family/patient expects to be discharged to:: Private residence Living Arrangements: Alone   Type of Home: Mobile home Home Access: Stairs to enter Entrance Stairs-Rails: Right;Left;Can reach both Entrance Stairs-Number of Steps: 4 Home Layout: Multi-level   Additional Comments: pt lives alone in a 5th wheel trailer.     Prior Function Level of Independence: Independent         Comments: pt does not remember falling in bathroom. Difficult to get a history because pt in so much pain     Hand Dominance  Extremity/Trunk Assessment   Upper Extremity Assessment Upper Extremity Assessment: Generalized weakness    Lower Extremity Assessment Lower Extremity Assessment: Generalized weakness       Communication   Communication: No difficulties  Cognition Arousal/Alertness: Awake/alert Behavior During Therapy: WFL for tasks assessed/performed Overall Cognitive Status: No family/caregiver present to determine baseline cognitive functioning                                 General Comments: difficult to assess due to very distracted by pain. He reports that he has been hallucinating and does not know what really happened vs  what he has hallucinated      General Comments General comments (skin integrity, edema, etc.): would potentially benefit from Vital Lift Go bed to allow for chair position and progression into standing    Exercises General Exercises - Upper Extremity Shoulder Flexion: AROM;Both;10 reps;Supine General Exercises - Lower Extremity Ankle Circles/Pumps: AROM;Both;10 reps;Supine Heel Slides: AAROM;Both;10 reps;Supine Straight Leg Raises: AAROM;Both;5 reps;Supine   Assessment/Plan    PT Assessment Patient needs continued PT services  PT Problem List Decreased strength;Decreased activity tolerance;Decreased balance;Decreased mobility;Decreased knowledge of precautions;Pain;Obesity       PT Treatment Interventions DME instruction;Gait training;Functional mobility training;Therapeutic activities;Therapeutic exercise;Balance training;Neuromuscular re-education;Patient/family education    PT Goals (Current goals can be found in the Care Plan section)  Acute Rehab PT Goals Patient Stated Goal: decreased pain PT Goal Formulation: With patient Time For Goal Achievement: 01/30/19 Potential to Achieve Goals: Fair    Frequency Min 2X/week   Barriers to discharge Decreased caregiver support;Inaccessible home environment      Co-evaluation               AM-PAC PT "6 Clicks" Mobility  Outcome Measure Help needed turning from your back to your side while in a flat bed without using bedrails?: Total Help needed moving from lying on your back to sitting on the side of a flat bed without using bedrails?: Total Help needed moving to and from a bed to a chair (including a wheelchair)?: Total Help needed standing up from a chair using your arms (e.g., wheelchair or bedside chair)?: Total Help needed to walk in hospital room?: Total Help needed climbing 3-5 steps with a railing? : Total 6 Click Score: 6    End of Session Equipment Utilized During Treatment: Oxygen Activity Tolerance:  Patient limited by pain Patient left: in bed;with call bell/phone within reach Nurse Communication: Mobility status PT Visit Diagnosis: History of falling (Z91.81);Muscle weakness (generalized) (M62.81);Difficulty in walking, not elsewhere classified (R26.2);Pain Pain - Right/Left: (both) Pain - part of body: (back and ribs)    Time: 1350-1430 PT Time Calculation (min) (ACUTE ONLY): 40 min   Charges:   PT Evaluation $PT Eval Moderate Complexity: 1 Mod PT Treatments $Therapeutic Activity: 23-37 mins        Lyanne Co, PT  Acute Rehab Services  Pager [phone] Office [phone]   Lawana Chambers Franciszek Platten 01/16/2019, 2:59 PM

## 2019-01-16 NOTE — Plan of Care (Signed)
  Problem: Education: Goal: Knowledge of General Education information will improve Description Including pain rating scale, medication(s)/side effects and non-pharmacologic comfort measures Outcome: Progressing   Problem: Clinical Measurements: Goal: Ability to maintain clinical measurements within normal limits will improve Outcome: Progressing   Problem: Activity: Goal: Risk for activity intolerance will decrease Outcome: Progressing   Problem: Pain Managment: Goal: General experience of comfort will improve Outcome: Progressing   Problem: Safety: Goal: Ability to remain free from injury will improve Outcome: Progressing   Problem: Activity: Goal: Capacity to carry out activities will improve Outcome: Progressing

## 2019-01-16 NOTE — Progress Notes (Signed)
Patient ID: William Peterson, male   DOB: 01-18-1946, 73 y.o.   MRN: [id number] [facility] Progress Note   Assessment/ Plan:   1.  Acute kidney injury: Clinically suspected to be from mild rhabdomyolysis and exacerbated by CHF decompensation/renal hypoperfusion.  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  Will await labs from this morning to decide on additional management-most likely involving re-dosing diuretics given physical exam findings of hypervolemia. 2.  Hyperkalemia: Mild and secondary to acute kidney injury versus muscle injury.    Awaiting labs from this morning. 3.  Fall/mild rhabdomyolysis: Ongoing evaluation for possible etiology of fall. 4.  History of diastolic heart failure: 2D echocardiogram repeated yesterday with inability to evaluate diastolic function but showed preserved systolic function. 5.  Possible pneumonia/leukocytosis: On broad-spectrum antimicrobial therapy with vancomycin and Zosyn.  Follow this closely/renally adjust to avoid worsening renal insufficiency.  Subjective:   Reports to be in pain from the sites injured during his fall.   Objective:   BP (!) 101/55 (BP Location: Right Arm)   Pulse 65   Temp (!) 97.5 F (36.4 C) (Oral)   Resp (!) 24   Ht  (1.778 m)   Wt (!) 163.7 kg Comment: a scale  SpO2 96%   BMI 51.80 kg/m   Intake/Output Summary (Last 24 hours) at 01/16/2019 3086 Last data filed at 01/16/2019 0457 Gross per 24 hour  Intake 2675.72 ml  Output 3150 ml  Net -474.28 ml   Weight change: 9.072 kg  Physical Exam: Gen: Appears to be uncomfortable resting in bed, has eaten breakfast CVS: Pulse regular rhythm, normal rate, S1 and S2 normal Resp: Poor inspiratory effort with decreased breath sounds over bases Abd: Soft, obese, nontender Ext: 1-2+ lower extremity edema  Imaging: Dg Chest 2  View  Result Date: 01/14/2019 CLINICAL DATA:  Dyspnea EXAM: CHEST - 2 VIEW COMPARISON:  01/01/2019 chest radiograph. FINDINGS: Low lung volumes. Stable cardiomediastinal silhouette with mild cardiomegaly. No pneumothorax. Patchy opacity at both lung bases. No overt pulmonary edema. IMPRESSION: Low lung volumes. Patchy bibasilar lung opacities, differential includes aspiration, pneumonia or atelectasis. Chest radiograph follow-up advised. Electronically Signed   By: Delbert Phenix M.D.   On: 01/14/2019 12:24   Dg Lumbar Spine Complete  Result Date: 01/14/2019 CLINICAL DATA:  Fall last night.  Back and left hip pain. EXAM: LUMBAR SPINE - COMPLETE 4+ VIEW COMPARISON:  Lumbar spine CT 09/18/2008. FINDINGS: There are 5 lumbar type vertebral bodies. The bones are mildly demineralized. No evidence of acute fracture or pars defect. The disc spaces are preserved. Moderate facet degenerative changes are present inferiorly. There are bilateral sacroiliac degenerative changes. There is diffuse aortic atherosclerosis. IMPRESSION: No evidence of acute lumbar spine injury. Facet arthropathy and aortic atherosclerosis noted. Electronically Signed   By: Carey Bullocks M.D.   On: 01/14/2019 12:30   Dg Knee 1-2 Views Left  Result Date: 01/15/2019 CLINICAL DATA:  PAIN AND SWELLING POST FALL EXAM: LEFT KNEE - 1-2 VIEW COMPARISON:  None. FINDINGS: Marginal spurs about all 3 compartments of the knee. Mild narrowing of the articular cartilage in the medial compartment. Negative for fracture or dislocation. No definite effusion. Normal alignment. Patchy popliteal arterial calcifications. IMPRESSION: 1. Negative for fracture or other acute bone abnormality. 2. Tricompartmental degenerative change, most marked medially. Electronically Signed   By: Corlis Leak M.D.   On: 01/15/2019 11:30   Ct Head Wo Contrast  Result Date: 01/14/2019 CLINICAL DATA:  Head trauma, headache EXAM: CT HEAD WITHOUT CONTRAST CT CERVICAL SPINE WITHOUT  CONTRAST TECHNIQUE: Multidetector CT imaging of the head and cervical spine was performed following the standard protocol without intravenous contrast. Multiplanar CT image reconstructions of the cervical spine were also generated. COMPARISON:  None. FINDINGS: CT HEAD FINDINGS Brain: Generalized atrophy without hydrocephalus. Diffuse white matter hypodensity bilaterally appears chronic. Negative for acute infarct, hemorrhage, or mass. No midline shift. Vascular: Negative for hyperdense vessel Skull: Negative Sinuses/Orbits: Mucosal edema paranasal sinuses. Bilateral cataract surgery. Other: None CT CERVICAL SPINE FINDINGS Alignment: Normal alignment Image quality degraded by patient motion. Skull base and vertebrae: Negative for fracture Soft tissues and spinal canal: Atherosclerotic calcification in the carotid artery bilaterally. Negative for soft tissue mass Disc levels: Mild disc and facet degeneration in the cervical spine. Upper chest: Lung apices clear bilaterally. Other: None IMPRESSION: 1. No acute intracranial abnormality. Atrophy with chronic white matter changes 2. Negative for cervical spine fracture.  Mild degenerative changes. Electronically Signed   By: Marlan Palauharles  Clark M.D.   On: 01/14/2019 14:05   Ct Chest Wo Contrast  Result Date: 01/14/2019 CLINICAL DATA:  Right chest pain since falling last night. History of CHF, COPD and rheumatoid arthritis. EXAM: CT CHEST WITHOUT CONTRAST TECHNIQUE: Multidetector CT imaging of the chest was performed following the standard protocol without IV contrast. COMPARISON:  Chest radiographs today and 01/01/2019. Report only from chest CT 06/10/2005. FINDINGS: Cardiovascular: Moderate atherosclerosis of the aorta, great vessels and coronary arteries. No acute vascular findings on noncontrast imaging. The heart size is normal. There is no pericardial effusion. Mediastinum/Nodes: There are no enlarged mediastinal, hilar or axillary lymph nodes.Hilar assessment is  limited by the lack of intravenous contrast. The thyroid gland, trachea and esophagus demonstrate no significant findings. Lungs/Pleura: There is no pleural effusion or pneumothorax. Mild centrilobular emphysema is present. There are patchy airspace opacities at both lung bases which are likely inflammatory. There is no consolidation or suspicious pulmonary nodule. There is a 6 mm perifissural nodule along the right middle lobe on image 69/5 which is likely a lymph node. Upper abdomen: Diffuse contour irregularity of the liver suspicious for cirrhosis. The liver also demonstrates decreased density consistent with steatosis. No focal lesion identified on noncontrast imaging. There are multiple layering calcified gallstones. There is a probable cyst posteriorly in the mid left kidney, incompletely visualized. Musculoskeletal/Chest wall: There are anterior compression deformities at T6 and T7 which are age indeterminate, not previously imaged. The T6 fracture results in greater than 50% loss vertebral body height, and there may be mild paraspinal hemorrhage at this level suggesting acuity. IMPRESSION: 1. Patchy bibasilar airspace opacities, consistent with pneumonia, possibly on the basis of aspiration. Followup PA and lateral chest X-ray is recommended in 3-4 weeks following trial of antibiotic therapy to ensure resolution and exclude underlying malignancy. 2. Age-indeterminate compression fractures at T6 and T7. There may be mild paraspinal hemorrhage associated with the T6 fracture, suggesting possible acuity. 3. Aortic Atherosclerosis (ICD10-I70.0) and Emphysema (ICD10-J43.9). 4. Contour irregularity of the liver suspicious for cirrhosis. Cholelithiasis. Electronically Signed   By: Carey BullocksWilliam  Veazey M.D.   On: 01/14/2019 14:14   Ct Cervical Spine Wo Contrast  Result Date: 01/14/2019 CLINICAL DATA:  Head trauma, headache EXAM: CT HEAD WITHOUT CONTRAST CT CERVICAL SPINE WITHOUT CONTRAST TECHNIQUE: Multidetector CT  imaging of the head and cervical spine was performed following the standard protocol without intravenous contrast. Multiplanar CT image reconstructions of the cervical spine were also  generated. COMPARISON:  None. FINDINGS: CT HEAD FINDINGS Brain: Generalized atrophy without hydrocephalus. Diffuse white matter hypodensity bilaterally appears chronic. Negative for acute infarct, hemorrhage, or mass. No midline shift. Vascular: Negative for hyperdense vessel Skull: Negative Sinuses/Orbits: Mucosal edema paranasal sinuses. Bilateral cataract surgery. Other: None CT CERVICAL SPINE FINDINGS Alignment: Normal alignment Image quality degraded by patient motion. Skull base and vertebrae: Negative for fracture Soft tissues and spinal canal: Atherosclerotic calcification in the carotid artery bilaterally. Negative for soft tissue mass Disc levels: Mild disc and facet degeneration in the cervical spine. Upper chest: Lung apices clear bilaterally. Other: None IMPRESSION: 1. No acute intracranial abnormality. Atrophy with chronic white matter changes 2. Negative for cervical spine fracture.  Mild degenerative changes. Electronically Signed   By: Marlan Palauharles  Clark M.D.   On: 01/14/2019 14:05   Mr Thoracic Spine Wo Contrast  Result Date: 01/15/2019 CLINICAL DATA:  Mid back pain after a fall last night. Compression fracture of T6. EXAM: MRI THORACIC AND LUMBAR SPINE WITHOUT CONTRAST TECHNIQUE: Multiplanar and multiecho pulse sequences of the thoracic and lumbar spine were obtained without intravenous contrast. COMPARISON:  None. Report of thoracic MRI dated 01/15/2006 FINDINGS: MRI THORACIC SPINE FINDINGS Alignment:  Slight accentuation of the thoracic kyphosis. Vertebrae: Is no acute severe compression fracture of T6. Tiny disc protrusion at T6-7 into the spinal canal touching the ventral aspect of the spinal cord but without spinal cord compression or myelopathy. The fracture of T6 involves the base of both pedicles. No  significant paraspinal hematoma. The other thoracic vertebra demonstrate no significant abnormalities. Cord:  No mass lesion or myelopathy.  No spinal cord compression. Paraspinal and other soft tissues: There is slight edema in posterior paraspinal musculature just to the right of midline at T6-7 consistent with soft tissue injury secondary to flexion injury. Disc levels: The discs throughout the thoracic spine demonstrate no significant abnormality other than a small broad-based disc protrusion at T6-7. MRI LUMBAR SPINE FINDINGS Segmentation:  Standard. Alignment:  Physiologic. Vertebrae: Tiny hemangiomata in the inferior aspects of the T12 and L1 vertebral bodies. No significant bone abnormality. Conus medullaris and cauda equina: Conus extends to the T12-L1 level. Conus and cauda equina appear normal. Paraspinal and other soft tissues: Multiple bilateral renal cysts, left more than right. Otherwise negative. Disc levels: L1-2 and L2-3: Negative. L3-4: Tiny broad-based disc bulge without neural impingement. Minimal degenerative changes of the facet joints. L4-5: Tiny annular tear at the level of the left neural foramen. No disc bulging or protrusion. Moderate bilateral facet arthritis. No neural impingement. L5-S1: Tiny broad-based disc bulge with no neural impingement. Slight degenerative changes of the facet joints. IMPRESSION: MR THORACIC SPINE IMPRESSION 1. Acute severe compression fracture of T6. Tiny broad-based disc protrusion at T6-7 without neural impingement. Slight soft tissue injury to the right of midline in the posterior paraspinal soft tissues at C6-7. 2. No other significant abnormality of the thoracic spine. MR LUMBAR SPINE IMPRESSION No acute or significant abnormality of the lumbar spine. Electronically Signed   By: Francene BoyersJames  Maxwell M.D.   On: 01/15/2019 15:22   Mr Lumbar Spine Wo Contrast  Result Date: 01/15/2019 CLINICAL DATA:  Mid back pain after a fall last night. Compression fracture of  T6. EXAM: MRI THORACIC AND LUMBAR SPINE WITHOUT CONTRAST TECHNIQUE: Multiplanar and multiecho pulse sequences of the thoracic and lumbar spine were obtained without intravenous contrast. COMPARISON:  None. Report of thoracic MRI dated 01/15/2006 FINDINGS: MRI THORACIC SPINE FINDINGS Alignment:  Slight accentuation of the thoracic kyphosis.  Vertebrae: Is no acute severe compression fracture of T6. Tiny disc protrusion at T6-7 into the spinal canal touching the ventral aspect of the spinal cord but without spinal cord compression or myelopathy. The fracture of T6 involves the base of both pedicles. No significant paraspinal hematoma. The other thoracic vertebra demonstrate no significant abnormalities. Cord:  No mass lesion or myelopathy.  No spinal cord compression. Paraspinal and other soft tissues: There is slight edema in posterior paraspinal musculature just to the right of midline at T6-7 consistent with soft tissue injury secondary to flexion injury. Disc levels: The discs throughout the thoracic spine demonstrate no significant abnormality other than a small broad-based disc protrusion at T6-7. MRI LUMBAR SPINE FINDINGS Segmentation:  Standard. Alignment:  Physiologic. Vertebrae: Tiny hemangiomata in the inferior aspects of the T12 and L1 vertebral bodies. No significant bone abnormality. Conus medullaris and cauda equina: Conus extends to the T12-L1 level. Conus and cauda equina appear normal. Paraspinal and other soft tissues: Multiple bilateral renal cysts, left more than right. Otherwise negative. Disc levels: L1-2 and L2-3: Negative. L3-4: Tiny broad-based disc bulge without neural impingement. Minimal degenerative changes of the facet joints. L4-5: Tiny annular tear at the level of the left neural foramen. No disc bulging or protrusion. Moderate bilateral facet arthritis. No neural impingement. L5-S1: Tiny broad-based disc bulge with no neural impingement. Slight degenerative changes of the facet joints.  IMPRESSION: MR THORACIC SPINE IMPRESSION 1. Acute severe compression fracture of T6. Tiny broad-based disc protrusion at T6-7 without neural impingement. Slight soft tissue injury to the right of midline in the posterior paraspinal soft tissues at C6-7. 2. No other significant abnormality of the thoracic spine. MR LUMBAR SPINE IMPRESSION No acute or significant abnormality of the lumbar spine. Electronically Signed   By: Francene Boyers M.D.   On: 01/15/2019 15:22   Mr Hip Left Wo Contrast  Result Date: 01/15/2019 CLINICAL DATA:  Left hip pain after a fall at home last night. EXAM: MR OF THE LEFT HIP WITHOUT CONTRAST TECHNIQUE: Multiplanar, multisequence MR imaging was performed. No intravenous contrast was administered. COMPARISON:  Radiographs dated 01/14/2019 FINDINGS: Bones: There is no fracture or dislocation or significant arthritis of the hips. Pelvic bones are intact. Sacroiliac joints are normal. Articular cartilage and labrum Articular cartilage:  Normal. Labrum:  Intact. Joint or bursal effusion Joint effusion:  Minimal left hip effusion. Bursae: No bursitis. Muscles and tendons Muscles and tendons: There is extensive edema in the left inferior adductor muscles as well as in the inferior aspects of left gluteus maximus muscles as well as in the left hamstring muscles. Hamstring tendons are intact. There is also slight edema in vastus medialis muscle of the left thigh. There is also slight edema in the left gluteus medius muscle. Other findings Miscellaneous:   None IMPRESSION: Extensive muscle strains in the left buttock and left hip and thigh. There are less prominent strains of the right gluteus maximus muscle and adductor muscles No fractures of the left hip or pelvic bones. Electronically Signed   By: Francene Boyers M.D.   On: 01/15/2019 15:28   Vas [place] Vanice Sarah With/wo Tbi  Result Date: 01/15/2019 LOWER EXTREMITY DOPPLER STUDY Indications: Cold, dusky feet.  Performing Technologist: Olen Cordial  Rvt  Examination Guidelines: A complete evaluation includes at minimum, Doppler waveform signals and systolic blood pressure reading at the level of bilateral brachial, anterior tibial, and posterior tibial arteries, when vessel segments are accessible. Bilateral testing is considered an integral part of a complete  examination. Photoelectric Plethysmograph (PPG) waveforms and toe systolic pressure readings are included as required and additional duplex testing as needed. Limited examinations for reoccurring indications may be performed as noted.  ABI Findings: +--------+------------------+-----+---------+--------+ Right   Rt Pressure (mmHg)IndexWaveform Comment  +--------+------------------+-----+---------+--------+ Brachial88                     triphasic         +--------+------------------+-----+---------+--------+ PTA     87                0.99 biphasic          +--------+------------------+-----+---------+--------+ DP      70                0.80 biphasic          +--------+------------------+-----+---------+--------+ +--------+------------------+-----+--------+-----------------------+ Left    Lt Pressure (mmHg)IndexWaveformComment                 +--------+------------------+-----+--------+-----------------------+ Brachial                               Pt guarding/positioning +--------+------------------+-----+--------+-----------------------+ PTA     87                0.99 biphasic                        +--------+------------------+-----+--------+-----------------------+ DP      59                0.67 biphasic                        +--------+------------------+-----+--------+-----------------------+ +-------+-----------+-----------+------------+------------+ ABI/TBIToday's ABIToday's TBIPrevious ABIPrevious TBI +-------+-----------+-----------+------------+------------+ Right  0.99                                            +-------+-----------+-----------+------------+------------+ Left   0.99                                           +-------+-----------+-----------+------------+------------+  Summary: Right: Resting right ankle-brachial index is within normal range. No evidence of significant right lower extremity arterial disease. Unable to obtain TBI due to patient movement. Left: Resting left ankle-brachial index is within normal range. No evidence of significant left lower extremity arterial disease. Unable to obtain TBI due to patient movement.  *See table(s) above for measurements and observations.  Electronically signed by Sherald Hess MD on 01/15/2019 at 5:18:27 PM.   Final    Dg Hip Unilat W Or Wo Pelvis 2-3 Views Left  Result Date: 01/14/2019 CLINICAL DATA:  Fall last night.  Back and left hip pain. EXAM: DG HIP (WITH OR WITHOUT PELVIS) 2-3V LEFT COMPARISON:  None. FINDINGS: Examination is limited by body habitus and positioning. The bones appear demineralized. No definite evidence of acute fracture or dislocation. However, the left femoral neck is suboptimally visualized. Mild degenerative changes of the hips, sacroiliac joints and lower lumbar spine. IMPRESSION: No definite acute osseous findings. Evaluation of the left hip is limited by body habitus and positioning. Consider CT for more definitive evaluation. Electronically Signed   By: Carey Bullocks M.D.   On: 01/14/2019 12:28    Labs: BMET Recent Labs  Lab 01/14/19 1058 01/15/19 0508  NA 141 141  K 5.4* 5.1  CL 102 104  CO2 23 22  GLUCOSE 85 65*  BUN 37* 55*  CREATININE 2.51* 2.75*  CALCIUM 8.5* 7.9*   CBC Recent Labs  Lab 01/14/19 1058 01/15/19 0508 01/16/19 0722  WBC 24.0* 18.3* 17.9*  NEUTROABS 22.6*  --   --   HGB 13.4 12.7* 13.0  HCT 45.4 41.1 41.9  MCV 95.8 94.9 95.9  PLT 291 207 208   Medications:    . Gerhardt's butt cream   Topical QID  . heparin  5,000 Units Subcutaneous Q8H  . mometasone-formoterol  2 puff  Inhalation BID  . sodium chloride flush  3 mL Intravenous Q12H   Zetta Bills, MD 01/16/2019, 8:07 AM

## 2019-01-17 ENCOUNTER — Inpatient Hospital Stay (HOSPITAL_COMMUNITY): Payer: Medicare Other

## 2019-01-17 DIAGNOSIS — J181 Lobar pneumonia, unspecified organism: Secondary | ICD-10-CM

## 2019-01-17 DIAGNOSIS — Z6841 Body Mass Index (BMI) 40.0 and over, adult: Secondary | ICD-10-CM

## 2019-01-17 DIAGNOSIS — M6282 Rhabdomyolysis: Secondary | ICD-10-CM

## 2019-01-17 DIAGNOSIS — J438 Other emphysema: Secondary | ICD-10-CM

## 2019-01-17 DIAGNOSIS — I5033 Acute on chronic diastolic (congestive) heart failure: Secondary | ICD-10-CM

## 2019-01-17 LAB — COMPREHENSIVE METABOLIC PANEL
ALT: 43 U/L (ref 0–44)
AST: 146 U/L — ABNORMAL HIGH (ref 15–41)
Albumin: 2 g/dL — ABNORMAL LOW (ref 3.5–5.0)
Alkaline Phosphatase: 61 U/L (ref 38–126)
Anion gap: 10 (ref 5–15)
BUN: 42 mg/dL — ABNORMAL HIGH (ref 8–23)
CO2: 28 mmol/L (ref 22–32)
Calcium: 8.4 mg/dL — ABNORMAL LOW (ref 8.9–10.3)
Chloride: 103 mmol/L (ref 98–111)
Creatinine, Ser: 1.38 mg/dL — ABNORMAL HIGH (ref 0.61–1.24)
GFR calc Af Amer: 59 mL/min — ABNORMAL LOW (ref >60)
GFR calc non Af Amer: 51 mL/min — ABNORMAL LOW (ref >60)
Glucose, Bld: 99 mg/dL (ref 70–99)
Potassium: 4.4 mmol/L (ref 3.5–5.1)
Sodium: 141 mmol/L (ref 135–145)
Total Bilirubin: 1.2 mg/dL (ref 0.3–1.2)
Total Protein: 5 g/dL — ABNORMAL LOW (ref 6.5–8.1)

## 2019-01-17 LAB — CBC
HCT: 40.7 % (ref 39.0–52.0)
Hemoglobin: 12.2 g/dL — ABNORMAL LOW (ref 13.0–17.0)
MCH: 28.2 pg (ref 26.0–34.0)
MCHC: 30 g/dL (ref 30.0–36.0)
MCV: 94 fL (ref 80.0–100.0)
Platelets: 228 10*3/uL (ref 150–400)
RBC: 4.33 MIL/uL (ref 4.22–5.81)
RDW: 14.7 % (ref 11.5–15.5)
WBC: 12.4 10*3/uL — ABNORMAL HIGH (ref 4.0–10.5)
nRBC: 0 % (ref 0.0–0.2)

## 2019-01-17 LAB — CK
Total CK: 2508 U/L — ABNORMAL HIGH (ref 49–397)
Total CK: 2264 U/L — ABNORMAL HIGH (ref 49–397)

## 2019-01-17 MED ORDER — GUAIFENESIN ER 600 MG PO TB12
600.0000 mg | ORAL_TABLET | Freq: Two times a day (BID) | ORAL | Status: DC
  Administered 2019-01-17 – 2019-01-23 (×13): 600 mg via ORAL
  Filled 2019-01-17 (×13): qty 1

## 2019-01-17 MED ORDER — SODIUM CHLORIDE 0.9 % IV SOLN
2.0000 g | Freq: Two times a day (BID) | INTRAVENOUS | Status: DC
  Administered 2019-01-17 (×2): 2 g via INTRAVENOUS
  Filled 2019-01-17 (×5): qty 2

## 2019-01-17 MED ORDER — FUROSEMIDE 40 MG PO TABS
40.0000 mg | ORAL_TABLET | Freq: Every day | ORAL | Status: DC
  Administered 2019-01-17 – 2019-01-23 (×7): 40 mg via ORAL
  Filled 2019-01-17 (×7): qty 1

## 2019-01-17 MED ORDER — VANCOMYCIN HCL 10 G IV SOLR
1250.0000 mg | Freq: Two times a day (BID) | INTRAVENOUS | Status: DC
  Administered 2019-01-17: 1250 mg via INTRAVENOUS
  Filled 2019-01-17 (×3): qty 1250

## 2019-01-17 NOTE — Progress Notes (Signed)
Patient ID: William Peterson, male   DOB: 1946-04-15, 73 y.o.   MRN: [id number] [facility] Progress Note   Assessment/ Plan:   1.  Acute kidney injury: Multifactorial from CHF exacerbation/renal hypoperfusion and mild rhabdomyolysis, nonoliguric and with improving renal function noted overnight.  I will restart his home dose of furosemide 40 mg daily for management of hyperkalemia. 2.  Fall/mild rhabdomyolysis: Mechanical fall resulting in an acute severe compression fracture of T6 with severe muscle strain of the left hip.  Pain control/management per primary service.  Seen yesterday by interventional radiology with plans for T6 vertebroplasty if back pain persists after pneumonia treated. 3.  History of diastolic heart failure: 2D echocardiogram repeated yesterday with inability to evaluate diastolic function but showed preserved systolic function.  Restart furosemide 40 mg daily. 4.  Possible pneumonia/leukocytosis:  On broad-spectrum antimicrobial coverage with cefepime and vancomycin, remains afebrile and with improving leukocytosis.    Renal function improving with trajectory towards baseline.  Will sign off at this time, call with questions or concerns.  Appropriate to follow-up with PCP.  Subjective:   Reports severe pain in his hips and lower back-requesting to be repositioned.   Objective:   BP (!) 103/43   Pulse 71   Temp 98.4 F (36.9 C) (Oral)   Resp 16   Ht 5\' 10"  (1.778 m)   Wt (!) 176.9 kg   SpO2 99%   BMI 55.96 kg/m   Intake/Output Summary (Last 24 hours) at 01/17/2019 0818 Last data filed at 01/17/2019 0232 Gross per 24 hour  Intake 720 ml  Output 1650 ml  Net -930 ml   Weight change: 13.2 kg  Physical Exam: Gen: Uncomfortably resting in bed CVS: Pulse regular rhythm, normal rate, S1 and S2 normal Resp: Poor inspiratory effort with decreased breath sounds over bases Abd: Soft, obese, nontender Ext: 1-2+ lower extremity edema  Imaging: Dg  Knee 1-2 Views Left  Result Date: 01/15/2019 CLINICAL DATA:  PAIN AND SWELLING POST FALL EXAM: LEFT KNEE - 1-2 VIEW COMPARISON:  None. FINDINGS: Marginal spurs about all 3 compartments of the knee. Mild narrowing of the articular cartilage in the medial compartment. Negative for fracture or dislocation. No definite effusion. Normal alignment. Patchy popliteal arterial calcifications. IMPRESSION: 1. Negative for fracture or other acute bone abnormality. 2. Tricompartmental degenerative change, most marked medially. Electronically Signed   By: Corlis Leak  Hassell M.D.   On: 01/15/2019 11:30   Mr Thoracic Spine Wo Contrast  Result Date: 01/15/2019 CLINICAL DATA:  Mid back pain after a fall last night. Compression fracture of T6. EXAM: MRI THORACIC AND LUMBAR SPINE WITHOUT CONTRAST TECHNIQUE: Multiplanar and multiecho pulse sequences of the thoracic and lumbar spine were obtained without intravenous contrast. COMPARISON:  None. Report of thoracic MRI dated 01/15/2006 FINDINGS: MRI THORACIC SPINE FINDINGS Alignment:  Slight accentuation of the thoracic kyphosis. Vertebrae: Is no acute severe compression fracture of T6. Tiny disc protrusion at T6-7 into the spinal canal touching the ventral aspect of the spinal cord but without spinal cord compression or myelopathy. The fracture of T6 involves the base of both pedicles. No significant paraspinal hematoma. The other thoracic vertebra demonstrate no significant abnormalities. Cord:  No mass lesion or myelopathy.  No spinal cord compression. Paraspinal and other soft tissues: There is slight edema in posterior paraspinal musculature just to the right of midline at T6-7 consistent with soft tissue injury secondary to flexion injury. Disc levels: The discs throughout the thoracic spine demonstrate no significant abnormality other  than a small broad-based disc protrusion at T6-7. MRI LUMBAR SPINE FINDINGS Segmentation:  Standard. Alignment:  Physiologic. Vertebrae: Tiny  hemangiomata in the inferior aspects of the T12 and L1 vertebral bodies. No significant bone abnormality. Conus medullaris and cauda equina: Conus extends to the T12-L1 level. Conus and cauda equina appear normal. Paraspinal and other soft tissues: Multiple bilateral renal cysts, left more than right. Otherwise negative. Disc levels: L1-2 and L2-3: Negative. L3-4: Tiny broad-based disc bulge without neural impingement. Minimal degenerative changes of the facet joints. L4-5: Tiny annular tear at the level of the left neural foramen. No disc bulging or protrusion. Moderate bilateral facet arthritis. No neural impingement. L5-S1: Tiny broad-based disc bulge with no neural impingement. Slight degenerative changes of the facet joints. IMPRESSION: MR THORACIC SPINE IMPRESSION 1. Acute severe compression fracture of T6. Tiny broad-based disc protrusion at T6-7 without neural impingement. Slight soft tissue injury to the right of midline in the posterior paraspinal soft tissues at C6-7. 2. No other significant abnormality of the thoracic spine. MR LUMBAR SPINE IMPRESSION No acute or significant abnormality of the lumbar spine. Electronically Signed   By: Francene Boyers M.D.   On: 01/15/2019 15:22   Mr Lumbar Spine Wo Contrast  Result Date: 01/15/2019 CLINICAL DATA:  Mid back pain after a fall last night. Compression fracture of T6. EXAM: MRI THORACIC AND LUMBAR SPINE WITHOUT CONTRAST TECHNIQUE: Multiplanar and multiecho pulse sequences of the thoracic and lumbar spine were obtained without intravenous contrast. COMPARISON:  None. Report of thoracic MRI dated 01/15/2006 FINDINGS: MRI THORACIC SPINE FINDINGS Alignment:  Slight accentuation of the thoracic kyphosis. Vertebrae: Is no acute severe compression fracture of T6. Tiny disc protrusion at T6-7 into the spinal canal touching the ventral aspect of the spinal cord but without spinal cord compression or myelopathy. The fracture of T6 involves the base of both pedicles.  No significant paraspinal hematoma. The other thoracic vertebra demonstrate no significant abnormalities. Cord:  No mass lesion or myelopathy.  No spinal cord compression. Paraspinal and other soft tissues: There is slight edema in posterior paraspinal musculature just to the right of midline at T6-7 consistent with soft tissue injury secondary to flexion injury. Disc levels: The discs throughout the thoracic spine demonstrate no significant abnormality other than a small broad-based disc protrusion at T6-7. MRI LUMBAR SPINE FINDINGS Segmentation:  Standard. Alignment:  Physiologic. Vertebrae: Tiny hemangiomata in the inferior aspects of the T12 and L1 vertebral bodies. No significant bone abnormality. Conus medullaris and cauda equina: Conus extends to the T12-L1 level. Conus and cauda equina appear normal. Paraspinal and other soft tissues: Multiple bilateral renal cysts, left more than right. Otherwise negative. Disc levels: L1-2 and L2-3: Negative. L3-4: Tiny broad-based disc bulge without neural impingement. Minimal degenerative changes of the facet joints. L4-5: Tiny annular tear at the level of the left neural foramen. No disc bulging or protrusion. Moderate bilateral facet arthritis. No neural impingement. L5-S1: Tiny broad-based disc bulge with no neural impingement. Slight degenerative changes of the facet joints. IMPRESSION: MR THORACIC SPINE IMPRESSION 1. Acute severe compression fracture of T6. Tiny broad-based disc protrusion at T6-7 without neural impingement. Slight soft tissue injury to the right of midline in the posterior paraspinal soft tissues at C6-7. 2. No other significant abnormality of the thoracic spine. MR LUMBAR SPINE IMPRESSION No acute or significant abnormality of the lumbar spine. Electronically Signed   By: Francene Boyers M.D.   On: 01/15/2019 15:22   Mr Hip Left Wo Contrast  Result  Date: 01/15/2019 CLINICAL DATA:  Left hip pain after a fall at home last night. EXAM: MR OF THE  LEFT HIP WITHOUT CONTRAST TECHNIQUE: Multiplanar, multisequence MR imaging was performed. No intravenous contrast was administered. COMPARISON:  Radiographs dated 01/14/2019 FINDINGS: Bones: There is no fracture or dislocation or significant arthritis of the hips. Pelvic bones are intact. Sacroiliac joints are normal. Articular cartilage and labrum Articular cartilage:  Normal. Labrum:  Intact. Joint or bursal effusion Joint effusion:  Minimal left hip effusion. Bursae: No bursitis. Muscles and tendons Muscles and tendons: There is extensive edema in the left inferior adductor muscles as well as in the inferior aspects of left gluteus maximus muscles as well as in the left hamstring muscles. Hamstring tendons are intact. There is also slight edema in vastus medialis muscle of the left thigh. There is also slight edema in the left gluteus medius muscle. Other findings Miscellaneous:   None IMPRESSION: Extensive muscle strains in the left buttock and left hip and thigh. There are less prominent strains of the right gluteus maximus muscle and adductor muscles No fractures of the left hip or pelvic bones. Electronically Signed   By: Francene Boyers M.D.   On: 01/15/2019 15:28   Dg Chest Port 1 View  Result Date: 01/17/2019 CLINICAL DATA:  Worsening shortness of breath. Pneumonia. Chronic congestive heart failure and chronic respiratory failure with hypoxia. EXAM: PORTABLE CHEST 1 VIEW COMPARISON:  01/14/2019 FINDINGS: Heart size is stable. Low lung volumes are again seen. Mild bibasilar infiltrates show no significant change. No new or worsening areas of pulmonary opacity are seen. IMPRESSION: No significant change in mild bibasilar infiltrates. Electronically Signed   By: Myles Rosenthal M.D.   On: 01/17/2019 07:49   Vas [place] Vanice Sarah With/wo Tbi  Result Date: 01/15/2019 LOWER EXTREMITY DOPPLER STUDY Indications: Cold, dusky feet.  Performing Technologist: Olen Cordial Rvt  Examination Guidelines: A complete evaluation  includes at minimum, Doppler waveform signals and systolic blood pressure reading at the level of bilateral brachial, anterior tibial, and posterior tibial arteries, when vessel segments are accessible. Bilateral testing is considered an integral part of a complete examination. Photoelectric Plethysmograph (PPG) waveforms and toe systolic pressure readings are included as required and additional duplex testing as needed. Limited examinations for reoccurring indications may be performed as noted.  ABI Findings: +--------+------------------+-----+---------+--------+ Right   Rt Pressure (mmHg)IndexWaveform Comment  +--------+------------------+-----+---------+--------+ Brachial88                     triphasic         +--------+------------------+-----+---------+--------+ PTA     87                0.99 biphasic          +--------+------------------+-----+---------+--------+ DP      70                0.80 biphasic          +--------+------------------+-----+---------+--------+ +--------+------------------+-----+--------+-----------------------+ Left    Lt Pressure (mmHg)IndexWaveformComment                 +--------+------------------+-----+--------+-----------------------+ Brachial                               Pt guarding/positioning +--------+------------------+-----+--------+-----------------------+ PTA     87                0.99 biphasic                        +--------+------------------+-----+--------+-----------------------+  DP      59                0.67 biphasic                        +--------+------------------+-----+--------+-----------------------+ +-------+-----------+-----------+------------+------------+ ABI/TBIToday's ABIToday's TBIPrevious ABIPrevious TBI +-------+-----------+-----------+------------+------------+ Right  0.99                                           +-------+-----------+-----------+------------+------------+ Left   0.99                                            +-------+-----------+-----------+------------+------------+  Summary: Right: Resting right ankle-brachial index is within normal range. No evidence of significant right lower extremity arterial disease. Unable to obtain TBI due to patient movement. Left: Resting left ankle-brachial index is within normal range. No evidence of significant left lower extremity arterial disease. Unable to obtain TBI due to patient movement.  *See table(s) above for measurements and observations.  Electronically signed by Sherald Hesshristopher Clark MD on 01/15/2019 at 5:18:27 PM.   Final     Labs: BMET Recent Labs  Lab 01/14/19 1058 01/15/19 0508 01/16/19 0722 01/17/19 0556  NA 141 141 141 141  K 5.4* 5.1 4.6 4.4  CL 102 104 104 103  CO2 23 22 25 28   GLUCOSE 85 65* 71 99  BUN 37* 55* 58* 42*  CREATININE 2.51* 2.75* 2.16* 1.38*  CALCIUM 8.5* 7.9* 8.1* 8.4*   CBC Recent Labs  Lab 01/14/19 1058 01/15/19 0508 01/16/19 0722 01/17/19 0556  WBC 24.0* 18.3* 17.9* 12.4*  NEUTROABS 22.6*  --   --   --   HGB 13.4 12.7* 13.0 12.2*  HCT 45.4 41.1 41.9 40.7  MCV 95.8 94.9 95.9 94.0  PLT 291 207 208 228   Medications:    . Gerhardt's butt cream   Topical QID  . heparin  5,000 Units Subcutaneous Q8H  . lidocaine  1 patch Transdermal Q24H  . mometasone-formoterol  2 puff Inhalation BID  . sodium chloride flush  3 mL Intravenous Q12H   Zetta BillsJay Neala Miggins, MD 01/17/2019, 8:18 AM

## 2019-01-17 NOTE — Evaluation (Signed)
Occupational Therapy Evaluation Patient Details Name: William Peterson MRN: [id number] DOB: 10/04/1946 Today's Date: 01/17/2019    History of Present Illness 73 year old male with COPD chronic respiratory failure on 2 L nasal cannula, HTN, CHF (D), recently admitted @ [facility] 3/30-4/3 for COPD exacerbation, COVID 19 NEGATIVE and was discharged home, brought from home after at fall 10 PM 01/13/2019 in the toilet, fell backward was unable to get up.  Was also complaining of right-sided chest pain worse with palpation, in the ER found to have AKI with creatinine 2.5 elevated LFTs, CK of 10,000, troponin 0 0.07, lactic acid 5.2 WBC 20 4K chest x-ray with patchy bibasilar airspace opacities consistent with pneumonia, age indeterminate compression fracture at T6-T7 and was admitted.   Clinical Impression   Pt recently discharged from [facility] and was struggling at home. Larey Seat (amnesic about event) and is now experiencing severe pain in L leg (especially hip) and back at compression fracture. He is able to assist some with UB ADL (oveall mod to max A) and total A for all aspects of LB ADL. Utilized Vital-Go Tilt bed today and Pt was able to tolerate 30 degrees for approx 10 min before fatiguing. Pt was also able to participate in BUE exercises and BLE while utilizing the bed. OT recommending SNF for follow-up post-acute to maximize safety and independence in ADL and transfers.     Follow Up Recommendations  SNF;Supervision/Assistance - 24 hour    Equipment Recommendations  Other (comment)(defer to next venue)    Recommendations for Other Services       Precautions / Restrictions Precautions Precautions: Fall Restrictions Weight Bearing Restrictions: No      Mobility Bed Mobility Overal bed mobility: Needs Assistance Bed Mobility: Rolling Rolling: Total assist;+2 for physical assistance         General bed mobility comments: Rolled Pt for positioning in bed, Pt able to assist very minimally by  pushing/pulling on rails. Bed pad and draw sheet essential  Transfers                 General transfer comment: unable to tolerate    Balance                                           ADL either performed or assessed with clinical judgement   ADL Overall ADL's : Needs assistance/impaired     Grooming: Minimal assistance;Bed level   Upper Body Bathing: Moderate assistance;Bed level Upper Body Bathing Details (indicate cue type and reason): will need more than +2 assist for bathing back Lower Body Bathing: Total assistance;Bed level   Upper Body Dressing : Maximal assistance   Lower Body Dressing: Total assistance   Toilet Transfer: Total assistance   Toileting- Clothing Manipulation and Hygiene: Total assistance       Functional mobility during ADLs: (unable) General ADL Comments: limited by pain     Vision Baseline Vision/History: Wears glasses Wears Glasses: At all times Patient Visual Report: No change from baseline       Perception     Praxis      Pertinent Vitals/Pain Pain Assessment: Faces Faces Pain Scale: Hurts whole lot Pain Location: L hip and T6/7 level back Pain Descriptors / Indicators: Burning;Stabbing;Discomfort;Grimacing;Moaning Pain Intervention(s): Limited activity within patient's tolerance;Monitored during session;Repositioned     Hand Dominance     Extremity/Trunk Assessment Upper Extremity Assessment Upper Extremity Assessment:  RUE deficits/detail;LUE deficits/detail RUE Deficits / Details: shoulder FF to 60 degrees grossly 3/5 RUE: Unable to fully assess due to pain RUE Coordination: decreased fine motor;decreased gross motor(arthritis at baseline) LUE Deficits / Details: shoulder FF to 60 degrees grossly 3/5 LUE: Unable to fully assess due to pain LUE Sensation: (abrasions and scabs, sensitive to touch) LUE Coordination: decreased fine motor;decreased gross motor(arthritis at baseline)   Lower Extremity  Assessment Lower Extremity Assessment: LLE deficits/detail LLE Deficits / Details: Extensive muscle strains in the left buttock and left hip and thigh. There are less prominent strains of the right gluteus maximus muscle and adductor muscles. LLE Coordination: decreased fine motor;decreased gross motor   Cervical / Trunk Assessment Cervical / Trunk Assessment: Other exceptions Cervical / Trunk Exceptions: morbidly obese   Communication Communication Communication: No difficulties   Cognition Arousal/Alertness: Awake/alert Behavior During Therapy: WFL for tasks assessed/performed Overall Cognitive Status: No family/caregiver present to determine baseline cognitive functioning                                 General Comments: Pt more alert today. Able to tell more about history. Continues to be painful and amnesic about actual fall in bathroom   General Comments  Vital-Go Tilt bed utilized during sesssion, max tilt of 30 degrees for approx 10 min before Pt started to fatigue.  Chart put up in room and instructions taped up on wall for RN staff. Also talked with mobility Tech [place] - who is familiar with bed and he is comfortable operating and tilting patient.    Exercises Exercises: General Lower Extremity;General Upper Extremity General Exercises - Upper Extremity Shoulder Flexion: AROM;Both;10 reps;Supine(only to 60 degrees)   Shoulder Instructions      Home Living Family/patient expects to be discharged to:: Private residence Living Arrangements: Alone Available Help at Discharge: Friend(s);Available PRN/intermittently Type of Home: Mobile home Home Access: Stairs to enter Entrance Stairs-Number of Steps: 4 Entrance Stairs-Rails: Right;Left;Can reach both Home Layout: One level Alternate Level Stairs-Number of Steps: 2 Alternate Level Stairs-Rails: None Bathroom Shower/Tub: Chief Strategy Officer: Standard Bathroom Accessibility: No        Additional Comments: pt lives alone in a 5th wheel trailer. He says that he does not use ANY DME in the home because it doesn't fit- he uses walls and furniture      Prior Functioning/Environment Level of Independence: Independent        Comments: pt does not remember falling in bathroom. Cooks with his microwave, has children but [facility] are in [facility]. Lives in a [place] - likes his neighbors        OT Problem List: Decreased strength;Decreased range of motion;Decreased activity tolerance;Impaired balance (sitting and/or standing);Obesity;Impaired UE functional use;Pain      OT Treatment/Interventions:      OT Goals(Current goals can be found in the care plan section) Acute Rehab OT Goals Patient Stated Goal: decrease pain OT Goal Formulation: With patient Time For Goal Achievement: 01/31/19 Potential to Achieve Goals: Good  OT Frequency: Min 2X/week   Barriers to D/C: Inaccessible home environment;Decreased caregiver support          Co-evaluation              AM-PAC OT "6 Clicks" Daily Activity     Outcome Measure Help from another person eating meals?: A Lot Help from another person taking care of personal grooming?: A Lot Help from another  person toileting, which includes using toliet, bedpan, or urinal?: Total Help from another person bathing (including washing, rinsing, drying)?: A Lot Help from another person to put on and taking off regular upper body clothing?: A Lot Help from another person to put on and taking off regular lower body clothing?: Total 6 Click Score: 10   End of Session Equipment Utilized During Treatment: Oxygen;Other (comment)(Vital-Go Tilt Bed (BARI)) Nurse Communication: Mobility status;Other (comment)(education/info for bed posted on wall)  Activity Tolerance: Patient tolerated treatment well Patient left: in bed;with call bell/phone within reach  OT Visit Diagnosis: Other abnormalities of gait and mobility (R26.89);Unsteadiness on  feet (R26.81);Repeated falls (R29.6);Muscle weakness (generalized) (M62.81);History of falling (Z91.81);Other symptoms and signs involving cognitive function;Pain Pain - Right/Left: Left Pain - part of body: Hip;Knee(back)                Time: [id number] OT Time Calculation (min): 49 min Charges:  OT General Charges $OT Visit: 1 Visit OT Evaluation $OT Eval Moderate Complexity: 1 Mod  Sherryl MangesLaura Jacia Sickman OTR/L Acute Rehabilitation Services Pager: [phone] Office: [phone]  Evern BioLaura J Camillia Marcy 01/17/2019, 11:03 AM

## 2019-01-17 NOTE — Progress Notes (Signed)
Physical Therapy Treatment Patient Details Name: William Peterson MRN: [id number] DOB: Jul 03, 1946 Today's Date: 01/17/2019    History of Present Illness Pt is a 73 y.o. male initially admitted to [facility] 3/30-4/3 with COPD exacerbation (negative COVID) and d/c home, now admitted 01/14/19 after falling back into toilet, unable to get up, also c/o R-side chest pain. Found to have AKI; CXR consistent with PNA. Imaging showed age indeterminate T6-7 compression fx; extensive muscle strains in L buttock, L hip, L thigh; less prominent strains in R gluteus max and adductors. PMH includes COPD (baseline 2L O2 [place]), HTN, CHF.   PT Comments    Pt seen for first tilt with OT on bariatric VitalGo tilt bed. Currently requires totalA for bed mobility and repositioning; limited by pain with all mobility but willing to participate. Able to tolerate tilt to 30' and initiate BUE/BLE therex, easily fatigued. Tilt bed instructions and tilting schedule left posted in room. Mobility tech present and comfortable with use of bed; will plan to tilt pt with RN later. Continue to recommend SNF-level therapies; pt in agreement.    Follow Up Recommendations  SNF;Supervision/Assistance - 24 hour     Equipment Recommendations  None recommended by PT    Recommendations for Other Services       Precautions / Restrictions Precautions Precautions: Fall Restrictions Weight Bearing Restrictions: No    Mobility  Bed Mobility Overal bed mobility: Needs Assistance Bed Mobility: Rolling Rolling: Total assist;+2 for physical assistance         General bed mobility comments: Rolled Pt for positioning in bed, Pt able to assist very minimally by pushing/pulling on rails. Bed pad and draw sheet essential  Transfers                 General transfer comment: Tolerated tilt on bariatric VitalGo tilt bed to 30'  Ambulation/Gait             General Gait Details: unable   Stairs             Wheelchair  Mobility    Modified Rankin (Stroke Patients Only)       Balance                                            Cognition Arousal/Alertness: Awake/alert Behavior During Therapy: WFL for tasks assessed/performed Overall Cognitive Status: No family/caregiver present to determine baseline cognitive functioning                                 General Comments: Pt more alert today. Able to tell more about history. Continues to be painful and amnesic about actual fall in bathroom. Internally distracted by pain, but agreeable to participate      Exercises Other Exercises Other Exercises: Quad sets/total knee extension while tilted at 30'; pt easily fatigued, decreased quad activation noted in LLE    General Comments        Pertinent Vitals/Pain Pain Assessment: Faces Faces Pain Scale: Hurts whole lot Pain Location: Hips (R>L), mid-back  Pain Descriptors / Indicators: Burning;Stabbing;Discomfort;Grimacing;Moaning Pain Intervention(s): Limited activity within patient's tolerance;Monitored during session;Repositioned    Home Living                      Prior Function  PT Goals (current goals can now be found in the care plan section) Acute Rehab PT Goals Patient Stated Goal: decrease pain PT Goal Formulation: With patient Time For Goal Achievement: 01/30/19 Potential to Achieve Goals: Fair Progress towards PT goals: Progressing toward goals    Frequency    Min 2X/week      PT Plan Current plan remains appropriate    Co-evaluation              AM-PAC PT "6 Clicks" Mobility   Outcome Measure  Help needed turning from your back to your side while in a flat bed without using bedrails?: Total Help needed moving from lying on your back to sitting on the side of a flat bed without using bedrails?: Total Help needed moving to and from a bed to a chair (including a wheelchair)?: Total Help needed standing up from a  chair using your arms (e.g., wheelchair or bedside chair)?: Total Help needed to walk in hospital room?: Total Help needed climbing 3-5 steps with a railing? : Total 6 Click Score: 6    End of Session Equipment Utilized During Treatment: Oxygen Activity Tolerance: Patient limited by fatigue;Patient limited by pain Patient left: in bed;with call bell/phone within reach Nurse Communication: Mobility status PT Visit Diagnosis: History of falling (Z91.81);Muscle weakness (generalized) (M62.81);Difficulty in walking, not elsewhere classified (R26.2);Pain Pain - part of body: (Back, hips)     Time: 2800-3491 PT Time Calculation (min) (ACUTE ONLY): 49 min  Charges:  $Therapeutic Exercise: 8-22 mins $Therapeutic Activity: 8-22 mins                    Ina Homes, PT, DPT Acute Rehabilitation Services  Pager [phone] Office [phone]  Malachy Chamber 01/17/2019, 1:27 PM

## 2019-01-17 NOTE — Progress Notes (Signed)
PROGRESS NOTE  William Peterson ZOX:[id number] DOB: June 15, 1946 DOA: 01/14/2019 PCP: Doreen Salvage, PA-C     Brief Narrative: 73 year old male with COPD chronic respiratory failure on 2 L nasal cannula, HTN, CHF (D), recently admitted @ [facility] 3/30-4/3 for COPD exacerbation, COVID 19 NEGATIVE and was discharged home, brought from home after at fall 10 PM 01/13/2019 in the toilet, fell backward was unable to get up.  Was also complaining of right-sided chest pain worse with palpation, in the ER found to have AKI with creatinine 2.5 elevated LFTs, CK of 10,000, troponin 0 0.07, lactic acid 5.2 WBC 20 4K chest x-ray with patchy bibasilar airspace opacities consistent with pneumonia, age indeterminate compression fracture at T6-T7 and was admitted.  his sister on 4/13 states patient has been dealing with leg swelling for 3 months and chronic back pain worse in the past 2 weeks and overall he has not been doing well. Patient's MRI showed acute severe compression fracture of T6, severe muscle strain on the left hip.  Patient continues to have AKI slightly improving,pain on the left hip and back, and remains on broad-spectrum antibiotics for pneumonia.   HPI/Recap of past 24 hours:  Laying in bed, with intermittent congested cough, reports chronic bilateral lower extremity edema Currently denies pain, no fever 1.6 liter urine output last 24hrs, cr improved  Assessment/Plan: Principal Problem:   Fall Active Problems:   Chronic diastolic CHF (congestive heart failure) (HCC)   Chronic respiratory failure with hypoxia (HCC)   HCAP (healthcare-associated pneumonia)   COPD (chronic obstructive pulmonary disease) (HCC)   Rhabdomyolysis   Elevated troponin   AKI (acute kidney injury) (HCC)   HLD (hyperlipidemia)   Pressure injury of skin  Fall at home -CT head and neck negative for acute intracranial abnormality, negative for cervical spine fracture -Lumbar x-ray negative for acute lumbar spine  injury -MRI left hip : extensive muscle strains in the left buttock and left hip and thigh. There are less prominent strains of the right gluteus maximus muscle and adductor muscles -Age indeterminate compression fracture T6, T7 with mild paraspinal hemorrhage associated with T6 -Acute on Chronic back pain pain for 3 months or longer but worse in 2 weeks -Mri w T6 severe compression fracture. Cont pain control oxy and tramadol prn. -my colleague Dr Jonathon Bellows discussed with oncall  neurosurgon  on 4/13 over phone, does not recommend any neuurosurgical intervention and okay for IR If they decide to do kyphoplasty. - IR consulted, given his current pneumonian holding off on kyphoplasty until infection clears off.  Rhabdomyolysis -ck on presentation was 7273, he received ivf, ck trending down  Transaminitis:w Elevated isolated AST , likely in the setting of rhabdomyolysis and heart failure.  improving Ct does concern for underline cirrhosis Also drink alcohol ( reports a little bit) hold statin for now  AKI on CKDII Cr baseline 0.7-0.8 Cr on presentation was 2.75 -Multifactorial from CHF exacerbation/renal hypoperfusion and rhabdomyolysis, nonoliguric and with improving renal function noted overnight.  -nephrology input appreciated, he is started back on his home dose of furosemide 40 mg daily for management of hyperkalemia.  HCAP? :  CT chest without contrast revealed patchy bibasilar airspace opacities with leukocytosis, of note was also on recent steroids from last discharge.   Had lactic acidosis on admission-sepsis versus related to recent steroids/abnormalities. Suspect from infection.  Overall hemodynamically stable, afebrile, wbc and lactate downtrending.  -Patient given Zosyn in the emergency room.  he has been on vanco/Cefepime since admission - MRSA  screen negative, blood culture NGTD. Repeat CXR this am "No significant change in mild bibasilar infiltrates" -he does appear  volume overloaded, continue to have congested cough, will obtain sputum culture if able, d/c vanc, continue cefepime for now -add mucinex -Followup PA and lateral chest X-ray is recommended in 3-4 weeks following trial of antibiotic therapy to ensure resolution and exclude underlying malignancy  Acute on chronic diastolic chf exacerbation -Elevated troponin, mildly + and flat -He had right-sided chest pain, no overt ischemic changes on the EKG. Has reproducible right-sided chest pain likely musculoskeletal.TTE w lvef 55-60% and normal LV and RV size  -he has bilateral lower extremity pitting edema, abi unremarkable, will get venous US -Diuresing well with lasix, remain significant edematous ( reports ongoing edema for 38months) Continue diuresis as long as bp and cr allows   COPD on home o2 -was recently hospitalized for COPD exacerbation  -stable, continue home dulera, albuterol nebs Quick smoking  Cirrhosis/cholelithiasis Incidental findings on CT scan   Morbid obesity  Body mass index is 55.96 kg/m.   FTT: needs snf placement once medically stable  Code Status: full  Family Communication: patient   Disposition Plan: not ready to discharge, will need snf once medically ready to discharge   Consultants:  Nephrology for [facility], nephrology signed off on 4/15  IR for  Thoracic 6 vertebroplasty  Procedures:  none  Antibiotics:  vanc from admission to 4/15  Cefepime from admission    Objective: BP (!) 111/56 (BP Location: Left Arm)   Pulse 64   Temp 97.8 F (36.6 C) (Oral)   Resp (!) 22   Ht 5\' 10"  (1.778 m)   Wt (!) 176.9 kg   SpO2 (!) 84%   BMI 55.96 kg/m   Intake/Output Summary (Last 24 hours) at 01/17/2019 1350 Last data filed at 01/17/2019 1325 Gross per 24 hour  Intake 820 ml  Output 1650 ml  Net -830 ml   Filed Weights   01/15/19 0120 01/16/19 0114 01/17/19 0500  Weight: (!) 161 kg (!) 163.7 kg (!) 176.9 kg    Exam: Patient is examined daily  including today on 01/17/2019, exams remain the same as of yesterday except that has changed    General:  Obese, chronically ill, NAD  Cardiovascular: RRR  Respiratory: + rhonchi, no wheezing  Abdomen: Soft/ND/NT, positive BS  Musculoskeletal: bilateral  Lower extremity 3+ pitting Edema  Neuro: alert, oriented   Data Reviewed: Basic Metabolic Panel: Recent Labs  Lab 01/14/19 1058 01/15/19 0508 01/16/19 0722 01/17/19 0556  NA 141 141 141 141  K 5.4* 5.1 4.6 4.4  CL 102 104 104 103  CO2 23 22 25 28   GLUCOSE 85 65* 71 99  BUN 37* 55* 58* 42*  CREATININE 2.51* 2.75* 2.16* 1.38*  CALCIUM 8.5* 7.9* 8.1* 8.4*   Liver Function Tests: Recent Labs  Lab 01/14/19 1058 01/15/19 0508 01/16/19 0722 01/17/19 0556  AST 209* 244* 193* 146*  ALT 38 39 41 43  ALKPHOS 59 63 62 61  BILITOT 2.3* 1.3* 1.2 1.2  PROT 5.9* 4.6* 5.1* 5.0*  ALBUMIN 2.8* 2.0* 2.1* 2.0*   No results for input(s): LIPASE, AMYLASE in the last 168 hours. No results for input(s): AMMONIA in the last 168 hours. CBC: Recent Labs  Lab 01/14/19 1058 01/15/19 0508 01/16/19 0722 01/17/19 0556  WBC 24.0* 18.3* 17.9* 12.4*  NEUTROABS 22.6*  --   --   --   HGB 13.4 12.7* 13.0 12.2*  HCT 45.4 41.1 41.9 40.7  MCV 95.8 94.9 95.9 94.0  PLT 291 207 208 228   Cardiac Enzymes:   Recent Labs  Lab 01/14/19 1058 01/14/19 1849 01/14/19 2356 01/15/19 0508 01/15/19 0816 01/15/19 1924 01/16/19 0722 01/16/19 2101 01/17/19 0556  CKTOTAL 10,002*  --   --   --  7,273* 5,800* 3,922* 3,213* 2,508*  TROPONINI 0.07* 0.08* 0.05* 0.05*  --   --   --   --   --    BNP (last 3 results) Recent Labs    01/14/19 1059  BNP 128.3*    ProBNP (last 3 results) No results for input(s): PROBNP in the last 8760 hours.  CBG: No results for input(s): GLUCAP in the last 168 hours.  Recent Results (from the past 240 hour(s))  Blood culture (routine x 2)     Status: None (Preliminary result)   Collection Time: 01/14/19 11:15 AM   Result Value Ref Range Status   Specimen Description BLOOD RIGHT WRIST  Final   Special Requests   Final    BOTTLES DRAWN AEROBIC AND ANAEROBIC Blood Culture results may not be optimal due to an inadequate volume of blood received in culture bottles   Culture   Final    NO GROWTH 3 DAYS Performed at [facility], [address] [place], [place] [address]    Report Status PENDING  Incomplete  Blood culture (routine x 2)     Status: None (Preliminary result)   Collection Time: 01/14/19  1:25 PM  Result Value Ref Range Status   Specimen Description BLOOD RIGHT WRIST  Final   Special Requests   Final    AEROBIC BOTTLE ONLY Blood Culture results may not be optimal due to an inadequate volume of blood received in culture bottles   Culture   Final    NO GROWTH 3 DAYS Performed at [facility], [address] [place], [place] [address]    Report Status PENDING  Incomplete  MRSA PCR Screening     Status: None   Collection Time: 01/14/19  5:37 PM  Result Value Ref Range Status   MRSA by PCR NEGATIVE NEGATIVE Final    Comment:        The GeneXpert MRSA Assay (FDA approved for NASAL specimens only), is one component of a comprehensive MRSA colonization surveillance program. It is not intended to diagnose MRSA infection nor to guide or monitor treatment for MRSA infections. Performed at [facility], [address] [place], [place] [address]      Studies: Dg Chest Port 1 View  Result Date: 01/17/2019 CLINICAL DATA:  Worsening shortness of breath. Pneumonia. Chronic congestive heart failure and chronic respiratory failure with hypoxia. EXAM: PORTABLE CHEST 1 VIEW COMPARISON:  01/14/2019 FINDINGS: Heart size is stable. Low lung volumes are again seen. Mild bibasilar infiltrates show no significant change. No new or worsening areas of pulmonary opacity are seen. IMPRESSION: No significant change in mild bibasilar infiltrates. Electronically Signed   By: Myles RosenthalJohn  Stahl M.D.    On: 01/17/2019 07:49    Scheduled Meds: . furosemide  40 mg Oral Daily  . Gerhardt's butt cream   Topical QID  . heparin  5,000 Units Subcutaneous Q8H  . lidocaine  1 patch Transdermal Q24H  . mometasone-formoterol  2 puff Inhalation BID  . sodium chloride flush  3 mL Intravenous Q12H    Continuous Infusions: . sodium chloride 250 mL (01/16/19 1441)  . ceFEPime (MAXIPIME) IV 2 g (01/17/19 1215)  . vancomycin  1,250 mg (01/17/19 1304)     Time spent: , case discussed with nephrology Dr Allena Katz I have personally reviewed and interpreted on  01/17/2019 daily labs, tele strips, imagings as discussed above under date review session and assessment and plans.  I reviewed all nursing notes, pharmacy notes, consultant notes,  vitals, pertinent old records  I have discussed plan of care as described above with RN , patient  on 01/17/2019   Albertine Grates MD, PhD  [facility] Pager [phone]. If 7PM-7AM, please contact night-coverage at [email or web address], password [facility] 01/17/2019, 1:50 PM  LOS: 3 days

## 2019-01-17 NOTE — Progress Notes (Signed)
Patient medicated for primary nurse, pt provided yaunker for self-suctioning of expectorated sputum.   Pt denies other needs at this time.

## 2019-01-17 NOTE — TOC Initial Note (Signed)
Transition of Care [facility]) - Initial/Assessment Note    Patient Details  Name: William Peterson MRN: [id number] Date of Birth: 1946/04/04  [facility]) CM/SW Contact:    Candie Chroman, LCSW Phone Number: 01/17/2019, 12:56 PM  Clinical Narrative:  CSW met with patient, introduced role, and explained that PT recommendations would be discussed. Patient agreeable to SNF placement. First preference is [facility] but their weight limit is 300 lbs. Sent referral out to other facilities in that area. No further concerns. CSW encouraged patient to contact CSW as needed. CSW will continue to follow patient for support and facilitate discharge to SNF once medically stable.         Expected Discharge Plan: Skilled Nursing Facility Barriers to Discharge: Continued Medical Work up, Other (comment)(Weight)   Patient Goals and CMS Choice Patient states their goals for this hospitalization and ongoing recovery are:: "To get where I can do something." CMS Medicare.gov Compare Post Acute Care list provided to:: Patient    Expected Discharge Plan and Services Expected Discharge Plan: [place] Choice: [place] arrangements for the past 2 months: Mobile Home                 DME Arranged: N/A DME Agency: NA HH Arranged: NA HH Agency: NA  Prior Living Arrangements/Services Living arrangements for the past 2 months: Mobile Home Lives with:: Self Patient language and need for interpreter reviewed:: No Do you feel safe going back to the place where you live?: Yes      Need for Family Participation in Patient Care: Yes (Comment) Care giver support system in place?: No (comment)(Home alone but planning for SNF.)   Criminal Activity/Legal Involvement Pertinent to Current Situation/Hospitalization: No - Comment as needed  Activities of Daily Living Home Assistive Devices/Equipment: Oxygen(2l [place] 24/7) ADL Screening (condition  at time of admission) Patient's cognitive ability adequate to safely complete daily activities?: No Is the patient deaf or have difficulty hearing?: No Does the patient have difficulty seeing, even when wearing glasses/contacts?: No Does the patient have difficulty concentrating, remembering, or making decisions?: No Patient able to express need for assistance with ADLs?: Yes Does the patient have difficulty dressing or bathing?: No Independently performs ADLs?: Yes (appropriate for developmental age) Does the patient have difficulty walking or climbing stairs?: Yes Weakness of Legs: Both Weakness of Arms/Hands: None  Permission Sought/Granted Permission sought to share information with : Facility Art therapist granted to share information with : Yes, Verbal Permission Granted     Permission granted to share info w AGENCY: SNF's        Emotional Assessment Appearance:: Appears stated age Attitude/Demeanor/Rapport: Engaged, Gracious Affect (typically observed): Accepting, Appropriate, Calm, Pleasant Orientation: : Oriented to Self, Oriented to Place, Oriented to  Time, Oriented to Situation Alcohol / Substance Use: Never Used Psych Involvement: No (comment)  Admission diagnosis:  chest pain Patient Active Problem List   Diagnosis Date Noted  . Pressure injury of skin 01/16/2019  . Fall 01/14/2019  . HCAP (healthcare-associated pneumonia) 01/14/2019  . COPD (chronic obstructive pulmonary disease) ([place]) 01/14/2019  . Rhabdomyolysis 01/14/2019  . Elevated troponin 01/14/2019  . AKI (acute kidney injury) ([place]) 01/14/2019  . HLD (hyperlipidemia) 01/14/2019  . Chronic diastolic CHF (congestive heart failure) ([place]) 01/01/2019  . Chronic respiratory failure with hypoxia ([place]) 01/01/2019  . HTN (hypertension) 01/01/2019   PCP:  Egbert Garibaldi, PA-C Pharmacy:   [facility] [phone] -  [place], [place] AT [place] [address] [place] [address] Phone: [phone] Fax: [phone]     Social Determinants of Health (SDOH) Interventions    Readmission Risk Interventions No flowsheet data found.

## 2019-01-17 NOTE — Progress Notes (Signed)
Physical Therapy Treatment Patient Details Name: William Peterson MRN: [id number] DOB: 29-Jan-1946 Today's Date: 01/17/2019    History of Present Illness Pt is a 73 y.o. male initially admitted to [facility] 3/30-4/3 with COPD exacerbation (negative COVID) and d/c home, now admitted 01/14/19 after falling back into toilet, unable to get up, also c/o R-side chest pain. Found to have AKI; CXR consistent with PNA. Imaging showed age indeterminate T6-7 compression fx; extensive muscle strains in L buttock, L hip, L thigh; less prominent strains in R gluteus max and adductors. PMH includes COPD (baseline 2L O2 [place]), HTN, CHF.   PT Comments    Pt seen for additional tilting session. Tolerated tilt to 36'; limited by fatigue and c/o significant back pain with all mobility. Despite this, pt remains in good spirits and agreeable to participate. Will continue to follow acutely.  Follow Up Recommendations  SNF;Supervision/Assistance - 24 hour     Equipment Recommendations  None recommended by PT    Recommendations for Other Services       Precautions / Restrictions Precautions Precautions: Fall Restrictions Weight Bearing Restrictions: No    Mobility  Bed Mobility Overal bed mobility: Needs Assistance Bed Mobility: Rolling Rolling: Total assist;+2 for physical assistance         General bed mobility comments: TotalA for repositioning in bed with bed pads, pt not able to assist with BUE/BLEs due to c/o pain  Transfers Overall transfer level: Needs assistance               General transfer comment: Tolerated tilt on bariatric VitalGo tilt bed to 19'  Ambulation/Gait                 Stairs             Wheelchair Mobility    Modified Rankin (Stroke Patients Only)       Balance                                            Cognition Arousal/Alertness: Awake/alert Behavior During Therapy: WFL for tasks assessed/performed Overall Cognitive Status:  No family/caregiver present to determine baseline cognitive functioning                                 General Comments: Alert and interacting appropriately, although seems intermittently confused. Internally distracted by pain, but agreeable to participate      Exercises Other Exercises Other Exercises: Standing quad sets/TKEs, glut sets, weight shift towards R-side (pt with L-lateral lean upon tilting)    General Comments        Pertinent Vitals/Pain Pain Assessment: Faces Faces Pain Scale: Hurts whole lot Pain Location: Hips (R>L), mid-back  Pain Descriptors / Indicators: Stabbing;Discomfort;Grimacing;Moaning Pain Intervention(s): Limited activity within patient's tolerance;Monitored during session;Repositioned    Home Living                      Prior Function            PT Goals (current goals can now be found in the care plan section) Acute Rehab PT Goals Patient Stated Goal: decrease pain PT Goal Formulation: With patient Time For Goal Achievement: 01/30/19 Potential to Achieve Goals: Fair Progress towards PT goals: Progressing toward goals    Frequency    Min 2X/week  PT Plan Current plan remains appropriate    Co-evaluation              AM-PAC PT "6 Clicks" Mobility   Outcome Measure  Help needed turning from your back to your side while in a flat bed without using bedrails?: Total Help needed moving from lying on your back to sitting on the side of a flat bed without using bedrails?: Total Help needed moving to and from a bed to a chair (including a wheelchair)?: Total Help needed standing up from a chair using your arms (e.g., wheelchair or bedside chair)?: Total Help needed to walk in hospital room?: Total Help needed climbing 3-5 steps with a railing? : Total 6 Click Score: 6    End of Session Equipment Utilized During Treatment: Oxygen Activity Tolerance: Patient limited by fatigue;Patient limited by  pain Patient left: in bed;with call bell/phone within reach Nurse Communication: Mobility status PT Visit Diagnosis: History of falling (Z91.81);Muscle weakness (generalized) (M62.81);Difficulty in walking, not elsewhere classified (R26.2);Pain Pain - part of body: (back, hips)     Time: 1583-0940 PT Time Calculation (min) (ACUTE ONLY): 24 min  Charges:  $Therapeutic Exercise: 8-22 mins $Therapeutic Activity: 8-22 mins                    Ina Homes, PT, DPT Acute Rehabilitation Services  Pager [phone] Office [phone]  Malachy Chamber 01/17/2019, 5:49 PM

## 2019-01-17 NOTE — NC FL2 (Signed)
[place] MEDICAID FL2 LEVEL OF CARE SCREENING TOOL     IDENTIFICATION  Patient Name: William Peterson Birthdate: 06-14-1946 Sex: male Admission Date (Current Location): 01/14/2019  [facility] and [place] Number:  [employer] and Address:  [facility] [place]. [facility], [address] [place], [place] [address]      Provider Number: [id number]  Attending Physician Name and Address:  Albertine Grates, MD  Relative Name and Phone Number:       Current Level of Care: Hospital Recommended Level of Care: Skilled Nursing Facility Prior Approval Number:    Date Approved/Denied:   PASRR Number: [id number]  Discharge Plan: SNF    Current Diagnoses: Patient Active Problem List   Diagnosis Date Noted  . Pressure injury of skin 01/16/2019  . Fall 01/14/2019  . HCAP (healthcare-associated pneumonia) 01/14/2019  . COPD (chronic obstructive pulmonary disease) (HCC) 01/14/2019  . Rhabdomyolysis 01/14/2019  . Elevated troponin 01/14/2019  . AKI (acute kidney injury) (HCC) 01/14/2019  . HLD (hyperlipidemia) 01/14/2019  . Chronic diastolic CHF (congestive heart failure) (HCC) 01/01/2019  . Chronic respiratory failure with hypoxia (HCC) 01/01/2019  . HTN (hypertension) 01/01/2019    Orientation RESPIRATION BLADDER Height & Weight     Self, Time, Situation, Place  O2(Nasal Canula 5 L) Continent, External catheter Weight: (!) 390 lb (176.9 kg) Height:  5\' 10"  (177.8 cm)  BEHAVIORAL SYMPTOMS/MOOD NEUROLOGICAL BOWEL NUTRITION STATUS  (None) (None) Incontinent Diet(Heart healthy)  AMBULATORY STATUS COMMUNICATION OF NEEDS Skin   Extensive Assist Verbally Bruising, Other (Comment), PU Stage and Appropriate Care(Blister, Excoriated, MASD, Skin tear. Deep tissue injury on right and left buttocks: No dressing.)   PU Stage 2 Dressing: (Left and right sacrum: Xeroform daily. Right and left scrotum: Foam.)                   Personal Care Assistance Level of Assistance  Bathing,  Feeding, Dressing Bathing Assistance: Maximum assistance Feeding assistance: Limited assistance Dressing Assistance: Maximum assistance     Functional Limitations Info  Sight, Hearing, Speech Sight Info: Adequate Hearing Info: Adequate Speech Info: Adequate    SPECIAL CARE FACTORS FREQUENCY  PT (By licensed PT), OT (By licensed OT)     PT Frequency: 5 x week OT Frequency: 5 x week            Contractures Contractures Info: Not present    Additional Factors Info  Code Status, Allergies Code Status Info: Full code Allergies Info: Naproxen Sodium           Current Medications (01/17/2019):  This is the current hospital active medication list Current Facility-Administered Medications  Medication Dose Route Frequency Provider Last Rate Last Dose  . 0.9 %  sodium chloride infusion   Intravenous PRN Noralee Stain, DO 10 mL/hr at 01/16/19 1441 250 mL at 01/16/19 1441  . albuterol (PROVENTIL) (2.5 MG/3ML) 0.083% nebulizer solution 2.5 mg  2.5 mg Nebulization Q6H PRN Noralee Stain, DO      . ceFEPIme (MAXIPIME) 2 g in sodium chloride 0.9 % 100 mL IVPB  2 g Intravenous Q12H Chano, Rinne, RPH 200 mL/hr at 01/17/19 1215 2 g at 01/17/19 1215  . furosemide (LASIX) tablet 40 mg  40 mg Oral Daily Zetta Bills, MD   40 mg at 01/17/19 1138  . Gerhardt's butt cream   Topical QID Kc, Ramesh, MD      . heparin injection 5,000 Units  5,000 Units Subcutaneous Q8H Noralee Stain, DO   5,000 Units  at 01/17/19 0508  . HYDROmorphone (DILAUDID) injection 0.5 mg  0.5 mg Intravenous Q4H PRN Kc, Ramesh, MD   0.5 mg at 01/17/19 1144  . lidocaine (LIDODERM) 5 % 1 patch  1 patch Transdermal Q24H Lanae Boast, MD   1 patch at 01/16/19 2018  . mometasone-formoterol (DULERA) 200-5 MCG/ACT inhaler 2 puff  2 puff Inhalation BID Noralee Stain, DO   2 puff at 01/17/19 0939  . ondansetron (ZOFRAN) tablet 4 mg  4 mg Oral Q6H PRN Noralee Stain, DO       Or  . ondansetron [facility]) injection 4 mg  4 mg  Intravenous Q6H PRN Noralee Stain, DO      . oxyCODONE (Oxy IR/ROXICODONE) immediate release tablet 10 mg  10 mg Oral Q6H PRN Lanae Boast, MD   10 mg at 01/16/19 2149  . sodium chloride flush (NS) 0.9 % injection 3 mL  3 mL Intravenous Q12H Noralee Stain, DO   3 mL at 01/17/19 1226  . traMADol (ULTRAM) tablet 50 mg  50 mg Oral Q6H PRN Noralee Stain, DO   50 mg at 01/17/19 0226  . vancomycin (VANCOCIN) 1,250 mg in sodium chloride 0.9 % 250 mL IVPB  1,250 mg Intravenous Q12H Norva Pavlov, [facility]         Discharge Medications: Please see discharge summary for a list of discharge medications.  Relevant Imaging Results:  Relevant Lab Results:   Additional Information SS#: [id number]. On an air mattress.  Margarito Liner, LCSW

## 2019-01-17 NOTE — Progress Notes (Signed)
Pharmacy Antibiotic Note  William Peterson is a 73 y.o. male admitted on 01/14/2019 with sepsis, HCAP.  Pharmacy has been consulted for Vanco/Cefepime dosing.  ID: Sepsis: HCAP. AF, WBC 24>12.4. LA 2.6 down. SCr 1.38 way down.  Vanc 4/12>> Zosyn 4/12>>4/12 Cefepime 4/12>>  4/12 BCx: NGTD 4/12: MRSA PCR: negative  Plan: Vanco 1250mg  IV q 12 hrs. AUC calculations when Scr stabilizes. Cefepime 2g increase to q 12h.     Height: 5\' 10"  (177.8 cm) Weight: (!) 390 lb (176.9 kg) IBW/kg (Calculated) : 73  Temp (24hrs), Avg:98.5 F (36.9 C), Min:98.4 F (36.9 C), Max:98.5 F (36.9 C)  Recent Labs  Lab 01/14/19 1058 01/14/19 1115 01/14/19 1300 01/14/19 1849 01/14/19 2356 01/15/19 0508 01/16/19 0722 01/17/19 0556  WBC 24.0*  --   --   --   --  18.3* 17.9* 12.4*  CREATININE 2.51*  --   --   --   --  2.75* 2.16* 1.38*  LATICACIDVEN  --  5.2* 4.1* 4.2* 2.6*  --   --   --     Estimated Creatinine Clearance: 79.6 mL/min (A) (by C-G formula based on SCr of 1.38 mg/dL (H)).    Allergies  Allergen Reactions  . Naproxen Sodium Rash   . Hagen Tidd S. Merilynn Finland, PharmD, BCPS Clinical Staff Pharmacist Ryheem, Ferroni Stillinger 01/17/2019 8:11 AM

## 2019-01-18 ENCOUNTER — Inpatient Hospital Stay (HOSPITAL_COMMUNITY): Payer: Medicare Other

## 2019-01-18 DIAGNOSIS — I82409 Acute embolism and thrombosis of unspecified deep veins of unspecified lower extremity: Secondary | ICD-10-CM

## 2019-01-18 LAB — CBC
HCT: 40.1 % (ref 39.0–52.0)
Hemoglobin: 12 g/dL — ABNORMAL LOW (ref 13.0–17.0)
MCH: 28.2 pg (ref 26.0–34.0)
MCHC: 29.9 g/dL — ABNORMAL LOW (ref 30.0–36.0)
MCV: 94.4 fL (ref 80.0–100.0)
Platelets: 238 10*3/uL (ref 150–400)
RBC: 4.25 MIL/uL (ref 4.22–5.81)
RDW: 14.6 % (ref 11.5–15.5)
WBC: 11.1 10*3/uL — ABNORMAL HIGH (ref 4.0–10.5)
nRBC: 0 % (ref 0.0–0.2)

## 2019-01-18 LAB — URINALYSIS, COMPLETE (UACMP) WITH MICROSCOPIC
Bilirubin Urine: NEGATIVE
Glucose, UA: NEGATIVE mg/dL
Ketones, ur: NEGATIVE mg/dL
Leukocytes,Ua: NEGATIVE
Nitrite: NEGATIVE
Protein, ur: NEGATIVE mg/dL
Specific Gravity, Urine: 1.016 (ref 1.005–1.030)
pH: 5 (ref 5.0–8.0)

## 2019-01-18 LAB — PROTIME-INR
INR: 1.2 (ref 0.8–1.2)
Prothrombin Time: 15.4 seconds — ABNORMAL HIGH (ref 11.4–15.2)

## 2019-01-18 LAB — COMPREHENSIVE METABOLIC PANEL
ALT: 45 U/L — ABNORMAL HIGH (ref 0–44)
AST: 122 U/L — ABNORMAL HIGH (ref 15–41)
Albumin: 2 g/dL — ABNORMAL LOW (ref 3.5–5.0)
Alkaline Phosphatase: 58 U/L (ref 38–126)
Anion gap: 11 (ref 5–15)
BUN: 33 mg/dL — ABNORMAL HIGH (ref 8–23)
CO2: 28 mmol/L (ref 22–32)
Calcium: 8.3 mg/dL — ABNORMAL LOW (ref 8.9–10.3)
Chloride: 102 mmol/L (ref 98–111)
Creatinine, Ser: 1.3 mg/dL — ABNORMAL HIGH (ref 0.61–1.24)
GFR calc Af Amer: >60 mL/min (ref >60)
GFR calc non Af Amer: 55 mL/min — ABNORMAL LOW (ref >60)
Glucose, Bld: 92 mg/dL (ref 70–99)
Potassium: 4.4 mmol/L (ref 3.5–5.1)
Sodium: 141 mmol/L (ref 135–145)
Total Bilirubin: 1.2 mg/dL (ref 0.3–1.2)
Total Protein: 5.4 g/dL — ABNORMAL LOW (ref 6.5–8.1)

## 2019-01-18 LAB — CK
Total CK: 1790 U/L — ABNORMAL HIGH (ref 49–397)
Total CK: 1536 U/L — ABNORMAL HIGH (ref 49–397)

## 2019-01-18 MED ORDER — SODIUM CHLORIDE 0.9 % IV SOLN
2.0000 g | Freq: Three times a day (TID) | INTRAVENOUS | Status: DC
  Administered 2019-01-18 – 2019-01-20 (×7): 2 g via INTRAVENOUS
  Filled 2019-01-18 (×10): qty 2

## 2019-01-18 MED ORDER — HEPARIN SODIUM (PORCINE) 5000 UNIT/ML IJ SOLN
5000.0000 [IU] | Freq: Three times a day (TID) | INTRAMUSCULAR | Status: DC
  Administered 2019-01-19 – 2019-01-22 (×9): 5000 [IU] via SUBCUTANEOUS
  Filled 2019-01-18 (×9): qty 1

## 2019-01-18 NOTE — Progress Notes (Signed)
Spoke with Dr. Roda Shutters and Dr. Corliss Skains re: possible vertebroplasy/kyphoplasty.   Patient has completed 5/7 days of antibiotics for suspected PNA.  Continues without fever, chills. WBC mildly elevated.   Per Dr. Corliss Skains, will get a repeat UA for patient. Results from 4/13 UA noted. Will speak with schedulers re: authorization from insurance for procedure.   Patient on SQ heparin.  Hold for possible procedure tomorrow.  NPO p MN.    Loyce Dys, MS RD PA-C

## 2019-01-18 NOTE — Progress Notes (Signed)
Pharmacy Antibiotic Note  William Peterson is a 73 y.o. male admitted on 01/14/2019 with Sepsis/HCAP.  Pharmacy has been consulted for Cefepime dosing.   ID: Sepsis: HCAP. AF, WBC 24>11.1. LA 2.6 down. SCr 1.3 down.  Vanc 4/12>>4/15 Zosyn 4/12>>4/12 Cefepime 4/12>>  4/12 BCx: NGTD 4/12: MRSA PCR: negative   Plan: D/c'd Vanco Cefepime 2g increased to q 8 hrs Pharmacy will sign off. Please reconsult for further dosing assitance.     Height: 5\' 10"  (177.8 cm) Weight: (!) 394 lb (178.7 kg) IBW/kg (Calculated) : 73  Temp (24hrs), Avg:97.8 F (36.6 C), Min:97.6 F (36.4 C), Max:97.9 F (36.6 C)  Recent Labs  Lab 01/14/19 1058 01/14/19 1115 01/14/19 1300 01/14/19 1849 01/14/19 2356 01/15/19 0508 01/16/19 0722 01/17/19 0556 01/18/19 0501  WBC 24.0*  --   --   --   --  18.3* 17.9* 12.4* 11.1*  CREATININE 2.51*  --   --   --   --  2.75* 2.16* 1.38* 1.30*  LATICACIDVEN  --  5.2* 4.1* 4.2* 2.6*  --   --   --   --     Estimated Creatinine Clearance: 85 mL/min (A) (by C-G formula based on SCr of 1.3 mg/dL (H)).    Allergies  Allergen Reactions  . Naproxen Sodium Rash    Griffin Dewilde S. Merilynn Finland, PharmD, BCPS Clinical Staff Pharmacist  [place], [facility] 01/18/2019 10:34 AM

## 2019-01-18 NOTE — Progress Notes (Signed)
Bilateral lower extremity venous duplex has been completed. Preliminary results can be found in CV Proc through chart review.   01/18/19 10:58 AM Olen Cordial RVT

## 2019-01-18 NOTE — Progress Notes (Signed)
PROGRESS NOTE  TERRAN KLINKE ZOX:[id number] DOB: 1945-10-29 DOA: 01/14/2019 PCP: Doreen Salvage, PA-C     Brief Narrative: 73 year old male with COPD chronic respiratory failure on 2 L nasal cannula, HTN, CHF (D), recently admitted @ [facility] 3/30-4/3 for COPD exacerbation, COVID 19 NEGATIVE and was discharged home, brought from home after at fall 10 PM 01/13/2019 in the toilet, fell backward was unable to get up.  Was also complaining of right-sided chest pain worse with palpation, in the ER found to have AKI with creatinine 2.5 elevated LFTs, CK of 10,000, troponin 0 0.07, lactic acid 5.2 WBC 20 4K chest x-ray with patchy bibasilar airspace opacities consistent with pneumonia, age indeterminate compression fracture at T6-T7 and was admitted.  his sister on 4/13 states patient has been dealing with leg swelling for 3 months and chronic back pain worse in the past 2 weeks and overall he has not been doing well. Patient's MRI showed acute severe compression fracture of T6, severe muscle strain on the left hip.  Patient continues to have AKI slightly improving,pain on the left hip and back, and remains on broad-spectrum antibiotics for pneumonia.   HPI/Recap of past 24 hours:   3liter urine output last 24hrs, ck continue to trend down, cr continue to improve, hemodynamically stable Laying in bed, c/o back pain,  He has not been out of bed due to back pain reports can control bowel and bladder No cough noticed today Persistent  bilateral lower extremity edema no fever   Assessment/Plan: Principal Problem:   Fall Active Problems:   Acute on chronic diastolic CHF (congestive heart failure) (HCC)   Chronic respiratory failure with hypoxia (HCC)   Pneumonia of both lower lobes due to infectious organism [facility])   COPD (chronic obstructive pulmonary disease) (HCC)   Rhabdomyolysis   Elevated troponin   AKI (acute kidney injury) (HCC)   HLD (hyperlipidemia)   Pressure injury of skin  Morbid obesity with BMI of 50.0-59.9, adult (HCC)  Fall at home -CT head and neck negative for acute intracranial abnormality, negative for cervical spine fracture -Lumbar x-ray negative for acute lumbar spine injury -MRI left hip : extensive muscle strains in the left buttock and left hip and thigh. There are less prominent strains of the right gluteus maximus muscle and adductor muscles -Age indeterminate compression fracture T6, T7 with mild paraspinal hemorrhage associated with T6 -Acute on Chronic back pain pain for 3 months or longer but worse in 2 weeks -MRI w T6 severe compression fracture. Cont pain control oxy and tramadol prn. -my colleague Dr Jonathon Bellows discussed with oncall [place] neurosurgon  on 4/13 over phone, does not recommend any neuurosurgical intervention and okay for IR If they decide to do kyphoplasty. - IR consulted, possible kyphoplasty early next week  Rhabdomyolysis -ck on presentation was 7273, he received ivf, ck trending down  Transaminitis:w Elevated isolated AST , likely in the setting of rhabdomyolysis and heart failure.  improving Ct does concern for underline cirrhosis Also drink alcohol ( reports a little bit) hold statin for now  AKI on CKDII Cr baseline 0.7-0.8 Cr on presentation was 2.75 -Multifactorial from CHF exacerbation/renal hypoperfusion and rhabdomyolysis, nonoliguric and with improving renal function noted overnight.  -nephrology input appreciated, he is started back on his home dose of furosemide 40 mg daily for management of hyperkalemia.  HCAP? :  -CT chest without contrast revealed patchy bibasilar airspace opacities with leukocytosis, of note was also on recent steroids from last discharge.   -Had lactic  acidosis on admission-sepsis versus related to recent steroids/abnormalities. Suspect from infection.  -Overall hemodynamically stable, afebrile, wbc and lactate downtrending.  -- MRSA screen negative, blood culture NGTD. Repeat CXR on  4/15 "No significant change in mild bibasilar infiltrates" -he does appear volume overloaded, has intermittent congested cough,  sputum culture ordered on 4/15, pending collection -Patient given Zosyn in the emergency room.   vanco/Cefepime started on admission -vanc d/ced on 4/15, continue cefepime for now day 5 of  Total 7 day abx planned. -started mucinex on 4/15, add on incentive spirometer -Followup PA and lateral chest X-ray is recommended in 3-4 weeks following trial of antibiotic therapy to ensure resolution and exclude underlying malignancy -wbc trending down, no fever  Acute on chronic diastolic chf exacerbation -Elevated troponin, mildly + and flat -He had right-sided chest pain, no overt ischemic changes on the EKG. Has reproducible right-sided chest pain likely musculoskeletal.TTE w lvef 55-60% and normal LV and RV size  -he has bilateral lower extremity pitting edema, abi unremarkable,  venous US negative for dvt -Diuresing well with lasix, remain significant edematous ( reports ongoing edema for 54months) Continue diuresis as long as bp and cr allows , he remain edematous  COPD on home o2 -was recently hospitalized for COPD exacerbation  -stable, continue home dulera, albuterol nebs Quick smoking  Cirrhosis/cholelithiasis Incidental findings on CT scan   Morbid obesity  Body mass index is 56.53 kg/m.   FTT: needs snf placement once medically stable  Wound care RN note from 4/13  WOC Nurse wound consult note Patient receiving care in [facility] 3E11.  Assisted with turning, positioning, and cleansing patient of a massive brown, pudding consistency BM by RN and NT.  Mobility aide assisted at conclusion to pull patient up in the [employer] low air loss bed.  Patient states he was sitting on his scrotum for an extended period of time as he could not get up. Reason for Consult: sacral wounds Wound type: There are extensive DTPIs to the scrotum, right posterior/inner thigh and left  posterior inner thigh.  No wound on the sacrum Pressure Injury POA: Yes Measurements: The DPTI of the scrotum involves the entire scrotum.  There is scattered peeling of the overlying skin.  The scrotum is purple and maroon. The right posterior thigh DTPI measures 15 cm x 15 cm and joins the scrotal DTPI.  There is some scattered peeling of the overlying skin in spots.  The wound in purple/maroon. The left posterior thigh DTPI is 15 cm x 6 cm and joins the scrotal DTPI.  There is scattered peeling of overlying skin.  The entire wound is purple/maroon. Dressing procedure/placement/frequency: Gerhardt's butt cream for the thigh wounds, 4 times daily.  Xeroform gauze [place] # [phone]) wrapped around the scrotum each shift.  It is not uncommon for DTPIs to evolve into unstageable PIs.  If this occurs, treatment will likely need changing. Monitor the wound area(s) for worsening of condition such as: Signs/symptoms of infection,  Increase in size,  Development of or worsening of odor, Development of pain, or increased pain at the affected locations.  Notify the medical team if any of these develop.  Thank you for the consult.  Discussed plan of care with the patient and bedside nurse.  WOC nurse will not follow at this time.  Please re-consult the WOC team if needed.  Helmut Muster, RN, MSN, CWOCN, CNS-BC, pager [phone]  Code Status: full  Family Communication: patient   Disposition Plan: not ready to discharge, will  need snf once medically ready to discharge   Consultants:  [facility] for [facility], nephrology signed off on 4/15  IR for  Thoracic 6 vertebroplasty  Procedures:  none  Antibiotics:  vanc from admission to 4/15  Cefepime from admission    Objective: BP 123/67 (BP Location: Right Arm)   Pulse 62   Temp 97.6 F (36.4 C) (Oral)   Resp 20   Ht 5\' 10"  (1.778 m)   Wt (!) 178.7 kg   SpO2 100%   BMI 56.53 kg/m   Intake/Output Summary (Last 24 hours) at 01/18/2019 0726  Last data filed at 01/18/2019 0245 Gross per 24 hour  Intake 783 ml  Output 3150 ml  Net -2367 ml   Filed Weights   01/16/19 0114 01/17/19 0500 01/18/19 0037  Weight: (!) 163.7 kg (!) 176.9 kg (!) 178.7 kg    Exam: Patient is examined daily including today on 01/18/2019, exams remain the same as of yesterday except that has changed    General:  Obese, chronically ill, NAD  Cardiovascular: RRR  Respiratory: occasional rhonchi,  no wheezing, no rales, overall very diminished   Abdomen: Soft/ND/NT, positive BS  Musculoskeletal: bilateral  Lower extremity 3+ pitting Edema  Neuro: alert, oriented   Data Reviewed: Basic Metabolic Panel: Recent Labs  Lab 01/14/19 1058 01/15/19 0508 01/16/19 0722 01/17/19 0556 01/18/19 0501  NA 141 141 141 141 141  K 5.4* 5.1 4.6 4.4 4.4  CL 102 104 104 103 102  CO2 23 22 25 28 28   GLUCOSE 85 65* 71 99 92  BUN 37* 55* 58* 42* 33*  CREATININE 2.51* 2.75* 2.16* 1.38* 1.30*  CALCIUM 8.5* 7.9* 8.1* 8.4* 8.3*   Liver Function Tests: Recent Labs  Lab 01/14/19 1058 01/15/19 0508 01/16/19 0722 01/17/19 0556 01/18/19 0501  AST 209* 244* 193* 146* 122*  ALT 38 39 41 43 45*  ALKPHOS 59 63 62 61 58  BILITOT 2.3* 1.3* 1.2 1.2 1.2  PROT 5.9* 4.6* 5.1* 5.0* 5.4*  ALBUMIN 2.8* 2.0* 2.1* 2.0* 2.0*   No results for input(s): LIPASE, AMYLASE in the last 168 hours. No results for input(s): AMMONIA in the last 168 hours. CBC: Recent Labs  Lab 01/14/19 1058 01/15/19 0508 01/16/19 0722 01/17/19 0556 01/18/19 0501  WBC 24.0* 18.3* 17.9* 12.4* 11.1*  NEUTROABS 22.6*  --   --   --   --   HGB 13.4 12.7* 13.0 12.2* 12.0*  HCT 45.4 41.1 41.9 40.7 40.1  MCV 95.8 94.9 95.9 94.0 94.4  PLT 291 207 208 228 238   Cardiac Enzymes:   Recent Labs  Lab 01/14/19 1058 01/14/19 1849 01/14/19 2356 01/15/19 0508  01/16/19 0722 01/16/19 2101 01/17/19 0556 01/17/19 1927 01/18/19 0501  CKTOTAL 10,002*  --   --   --    < > 3,922* 3,213* 2,508* 2,264*  1,790*  TROPONINI 0.07* 0.08* 0.05* 0.05*  --   --   --   --   --   --    < > = values in this interval not displayed.   BNP (last 3 results) Recent Labs    01/14/19 1059  BNP 128.3*    ProBNP (last 3 results) No results for input(s): PROBNP in the last 8760 hours.  CBG: No results for input(s): GLUCAP in the last 168 hours.  Recent Results (from the past 240 hour(s))  Blood culture (routine x 2)     Status: None (Preliminary result)   Collection Time: 01/14/19 11:15 AM  Result  Value Ref Range Status   Specimen Description BLOOD RIGHT WRIST  Final   Special Requests   Final    BOTTLES DRAWN AEROBIC AND ANAEROBIC Blood Culture results may not be optimal due to an inadequate volume of blood received in culture bottles   Culture   Final    NO GROWTH 3 DAYS Performed at [facility], [address], [place], [place] [address]    Report Status PENDING  Incomplete  Blood culture (routine x 2)     Status: None (Preliminary result)   Collection Time: 01/14/19  1:25 PM  Result Value Ref Range Status   Specimen Description BLOOD RIGHT WRIST  Final   Special Requests   Final    AEROBIC BOTTLE ONLY Blood Culture results may not be optimal due to an inadequate volume of blood received in culture bottles   Culture   Final    NO GROWTH 3 DAYS Performed at [facility], [address] [place], [place] [address]    Report Status PENDING  Incomplete  MRSA PCR Screening     Status: None   Collection Time: 01/14/19  5:37 PM  Result Value Ref Range Status   MRSA by PCR NEGATIVE NEGATIVE Final    Comment:        The GeneXpert MRSA Assay (FDA approved for NASAL specimens only), is one component of a comprehensive MRSA colonization surveillance program. It is not intended to diagnose MRSA infection nor to guide or monitor treatment for MRSA infections. Performed at [facility], [address] [place], [place] [address]      Studies: No results found.   Scheduled Meds: . furosemide  40 mg Oral Daily  . Gerhardt's butt cream   Topical QID  . guaiFENesin  600 mg Oral BID  . heparin  5,000 Units Subcutaneous Q8H  . lidocaine  1 patch Transdermal Q24H  . mometasone-formoterol  2 puff Inhalation BID  . sodium chloride flush  3 mL Intravenous Q12H    Continuous Infusions: . sodium chloride Stopped (01/17/19 2300)  . ceFEPime (MAXIPIME) IV Stopped (01/17/19 2300)     Time spent: , case discussed with interventional radiology over the phone today I have personally reviewed and interpreted on  01/18/2019 daily labs, tele strips, imagings as discussed above under date review session and assessment and plans.  I reviewed all nursing notes, pharmacy notes, consultant notes,  vitals, pertinent old records  I have discussed plan of care as described above with RN , patient  on 01/18/2019   Albertine Grates MD, PhD  [facility] Pager [phone]. If 7PM-7AM, please contact night-coverage at [email or web address], password [facility] 01/18/2019, 7:26 AM  LOS: 4 days

## 2019-01-18 NOTE — Plan of Care (Signed)
  Problem: Education: Goal: Knowledge of General Education information will improve Description Including pain rating scale, medication(s)/side effects and non-pharmacologic comfort measures 01/18/2019 0127 by Verneita Griffes, RN Outcome: Progressing 01/18/2019 0126 by Verneita Griffes, RN Outcome: Progressing   Problem: Health Behavior/Discharge Planning: Goal: Ability to manage health-related needs will improve 01/18/2019 0127 by Verneita Griffes, RN Outcome: Progressing 01/18/2019 0126 by Verneita Griffes, RN Outcome: Progressing   Problem: Clinical Measurements: Goal: Ability to maintain clinical measurements within normal limits will improve 01/18/2019 0127 by Verneita Griffes, RN Outcome: Progressing 01/18/2019 0126 by Verneita Griffes, RN Outcome: Progressing Goal: Will remain free from infection 01/18/2019 0127 by Verneita Griffes, RN Outcome: Progressing 01/18/2019 0126 by Verneita Griffes, RN Outcome: Progressing Goal: Diagnostic test results will improve 01/18/2019 0127 by Verneita Griffes, RN Outcome: Progressing 01/18/2019 0126 by Verneita Griffes, RN Outcome: Progressing Goal: Respiratory complications will improve 01/18/2019 0127 by Verneita Griffes, RN Outcome: Progressing 01/18/2019 0126 by Verneita Griffes, RN Outcome: Progressing Goal: Cardiovascular complication will be avoided 01/18/2019 0127 by Verneita Griffes, RN Outcome: Progressing 01/18/2019 0126 by Verneita Griffes, RN Outcome: Progressing   Problem: Activity: Goal: Risk for activity intolerance will decrease 01/18/2019 0127 by Verneita Griffes, RN Outcome: Progressing 01/18/2019 0126 by Verneita Griffes, RN Outcome: Progressing   Problem: Nutrition: Goal: Adequate nutrition will be maintained 01/18/2019 0127 by Verneita Griffes, RN Outcome: Progressing 01/18/2019 0126 by Verneita Griffes, RN Outcome: Progressing   Problem: Coping: Goal: Level of anxiety will decrease 01/18/2019 0127 by Verneita Griffes, RN Outcome: Progressing 01/18/2019 0126 by Verneita Griffes, RN Outcome: Progressing   Problem: Elimination: Goal: Will not experience complications related to bowel motility 01/18/2019 0127 by Verneita Griffes, RN Outcome: Progressing 01/18/2019 0126 by Verneita Griffes, RN Outcome: Progressing Goal: Will not experience complications related to urinary retention 01/18/2019 0127 by Verneita Griffes, RN Outcome: Progressing 01/18/2019 0126 by Verneita Griffes, RN Outcome: Progressing   Problem: Pain Managment: Goal: General experience of comfort will improve 01/18/2019 0127 by Verneita Griffes, RN Outcome: Progressing 01/18/2019 0126 by Verneita Griffes, RN Outcome: Progressing   Problem: Safety: Goal: Ability to remain free from injury will improve 01/18/2019 0127 by Verneita Griffes, RN Outcome: Progressing 01/18/2019 0126 by Verneita Griffes, RN Outcome: Progressing   Problem: Education: Goal: Ability to demonstrate management of disease process will improve 01/18/2019 0127 by Verneita Griffes, RN Outcome: Progressing 01/18/2019 0126 by Verneita Griffes, RN Outcome: Progressing Goal: Ability to verbalize understanding of medication therapies will improve 01/18/2019 0127 by Verneita Griffes, RN Outcome: Progressing 01/18/2019 0126 by Verneita Griffes, RN Outcome: Progressing   Problem: Activity: Goal: Capacity to carry out activities will improve 01/18/2019 0127 by Verneita Griffes, RN Outcome: Progressing 01/18/2019 0126 by Verneita Griffes, RN Outcome: Progressing   Problem: Cardiac: Goal: Ability to achieve and maintain adequate cardiopulmonary perfusion will improve 01/18/2019 0127 by Verneita Griffes, RN Outcome: Progressing 01/18/2019 0126 by Verneita Griffes, RN Outcome: Progressing

## 2019-01-19 ENCOUNTER — Inpatient Hospital Stay (HOSPITAL_COMMUNITY): Payer: Medicare Other

## 2019-01-19 DIAGNOSIS — S22050K Wedge compression fracture of T5-T6 vertebra, subsequent encounter for fracture with nonunion: Secondary | ICD-10-CM

## 2019-01-19 HISTORY — PX: IR FLUORO RM 30-60 MIN: IMG2384

## 2019-01-19 LAB — HEPATITIS PANEL, ACUTE
HCV Ab: <0.1 s/co ratio (ref 0.0–0.9)
Hep A IgM: NEGATIVE
Hep B C IgM: NEGATIVE
Hepatitis B Surface Ag: NEGATIVE

## 2019-01-19 LAB — CULTURE, BLOOD (ROUTINE X 2)
Culture: NO GROWTH
Culture: NO GROWTH

## 2019-01-19 LAB — COMPREHENSIVE METABOLIC PANEL
ALT: 37 U/L (ref 0–44)
AST: 84 U/L — ABNORMAL HIGH (ref 15–41)
Albumin: 1.9 g/dL — ABNORMAL LOW (ref 3.5–5.0)
Alkaline Phosphatase: 52 U/L (ref 38–126)
Anion gap: 5 (ref 5–15)
BUN: 26 mg/dL — ABNORMAL HIGH (ref 8–23)
CO2: 34 mmol/L — ABNORMAL HIGH (ref 22–32)
Calcium: 8.5 mg/dL — ABNORMAL LOW (ref 8.9–10.3)
Chloride: 100 mmol/L (ref 98–111)
Creatinine, Ser: 1.04 mg/dL (ref 0.61–1.24)
GFR calc Af Amer: >60 mL/min (ref >60)
GFR calc non Af Amer: >60 mL/min (ref >60)
Glucose, Bld: 90 mg/dL (ref 70–99)
Potassium: 4.2 mmol/L (ref 3.5–5.1)
Sodium: 139 mmol/L (ref 135–145)
Total Bilirubin: 1.2 mg/dL (ref 0.3–1.2)
Total Protein: 5 g/dL — ABNORMAL LOW (ref 6.5–8.1)

## 2019-01-19 MED ORDER — SODIUM CHLORIDE 0.9 % IV BOLUS: 500.0000 mL | Freq: Once | INTRAVENOUS | Status: DC

## 2019-01-19 MED ORDER — ENSURE ENLIVE PO LIQD
237.0000 mL | Freq: Three times a day (TID) | ORAL | Status: DC
  Administered 2019-01-19 – 2019-01-23 (×10): 237 mL via ORAL

## 2019-01-19 MED ORDER — CEFAZOLIN SODIUM-DEXTROSE 1-4 GM/50ML-% IV SOLN
1.0000 g | Freq: Once | INTRAVENOUS | Status: AC
  Administered 2019-01-19: 1 g via INTRAVENOUS
  Filled 2019-01-19: qty 50

## 2019-01-19 NOTE — Plan of Care (Signed)
  Problem: Clinical Measurements: Goal: Ability to maintain clinical measurements within normal limits will improve Outcome: Progressing   Problem: Clinical Measurements: Goal: Respiratory complications will improve Outcome: Progressing   Problem: Pain Managment: Goal: General experience of comfort will improve Outcome: Progressing   

## 2019-01-19 NOTE — TOC Progression Note (Signed)
Transition of Care [facility]) - Progression Note    Patient Details  Name: William Peterson MRN: [id number] Date of Birth: 1946-08-17  [facility]) CM/SW Contact  Margarito Liner, LCSW Phone Number: 01/19/2019, 4:23 PM  Clinical Narrative: Brought patient list of bed offers and made him aware that [facility] cannot accept him. [facility] in [place] is the only facility that offered in [facility]. He is concerned because it says it is 30 miles from his home. Patient gave CSW permission to call his aunt [name] [phone]) to discuss. Her preferences are the two [facility] facilities (which she understands he cannot go to) and [facility]. She will discuss with patient and his caregiver so he can give CSW official decision.  Expected Discharge Plan: Skilled Nursing Facility Barriers to Discharge: Continued Medical Work up, Other (comment)(Weight)  Expected Discharge Plan and Services Expected Discharge Plan: Skilled Nursing Facility     Post Acute Care Choice: Skilled Nursing Facility Living arrangements for the past 2 months: Mobile Home                 DME Arranged: N/A DME Agency: NA HH Arranged: NA HH Agency: NA   Social Determinants of Health (SDOH) Interventions    Readmission Risk Interventions No flowsheet data found.

## 2019-01-19 NOTE — Progress Notes (Signed)
Occupational Therapy Treatment Patient Details Name: William Peterson MRN: [id number] DOB: 1946/07/01 Today's Date: 01/19/2019    History of present illness Pt is a 73 y.o. male initially admitted to [facility] 3/30-4/3 with COPD exacerbation (negative COVID) and d/c home, now admitted 01/14/19 after falling back into toilet, unable to get up, also c/o R-side chest pain. Found to have AKI; CXR consistent with PNA. Imaging showed age indeterminate T6-7 compression fx; extensive muscle strains in L buttock, L hip, L thigh; less prominent strains in R gluteus max and adductors. PMH includes COPD (baseline 2L O2 ), HTN, CHF.   OT comments  Pt making progress towards OT goals this session, but impacted by pain a lot today. Pt was able to tolerate 36 degrees on the tilt bed for 10 min. During that time his BP dropped to 78/56, it did not improve after 3 min, so he was returned supine and his BP was 117/56. He was able to participate in grooming while in the tilt bed which was distracting for him momentarily. Pt continues to benefit from skilled OT in the acute setting and current POC remains appropriate.    Follow Up Recommendations  SNF;Supervision/Assistance - 24 hour    Equipment Recommendations  Other (comment)(defer to next venue)    Recommendations for Other Services      Precautions / Restrictions Precautions Precautions: Fall Restrictions Weight Bearing Restrictions: No       Mobility Bed Mobility               General bed mobility comments: TotalA for repositioning in bed with bed pads, pt not able to assist with BUE/BLEs due to c/o pain  Transfers Overall transfer level: Needs assistance               General transfer comment: Tolerated tilt on bariatric VitalGo tilt bed to 36' for 10 min, BP dropped during session (see vital flow sheets) returned WFL in supine    Balance                                           ADL either performed or assessed  with clinical judgement   ADL Overall ADL's : Needs assistance/impaired     Grooming: Minimal assistance;Wash/dry face;Standing(at 36 degree tilt)               Lower Body Dressing: Total assistance Lower Body Dressing Details (indicate cue type and reason): to don socks                     Vision       Perception     Praxis      Cognition Arousal/Alertness: Awake/alert Behavior During Therapy: WFL for tasks assessed/performed Overall Cognitive Status: No family/caregiver present to determine baseline cognitive functioning                                 General Comments: distracted by pain, will open eyes on command        Exercises     Shoulder Instructions       General Comments      Pertinent Vitals/ Pain       Pain Assessment: 0-10 Pain Score: 10-Worst pain ever(92 "just take me to the morgue") Faces Pain Scale: Hurts worst Pain Location: L legs and  back Pain Descriptors / Indicators: Stabbing;Discomfort;Grimacing;Moaning Pain Intervention(s): Limited activity within patient's tolerance;Repositioned;Premedicated before session  Home Living                                          Prior Functioning/Environment              Frequency  Min 2X/week        Progress Toward Goals  OT Goals(current goals can now be found in the care plan section)  Progress towards OT goals: Progressing toward goals  Acute Rehab OT Goals Patient Stated Goal: decrease pain OT Goal Formulation: With patient Time For Goal Achievement: 01/31/19 Potential to Achieve Goals: Good  Plan Discharge plan remains appropriate;Frequency remains appropriate    Co-evaluation    PT/OT/SLP Co-Evaluation/Treatment: Yes Reason for Co-Treatment: Other (comment);For patient/therapist safety(safety with tilt bed) PT goals addressed during session: Mobility/safety with mobility;Strengthening/ROM OT goals addressed during session: ADL's  and self-care;Strengthening/ROM      AM-PAC OT "6 Clicks" Daily Activity     Outcome Measure   Help from another person eating meals?: A Lot Help from another person taking care of personal grooming?: A Lot Help from another person toileting, which includes using toliet, bedpan, or urinal?: Total Help from another person bathing (including washing, rinsing, drying)?: A Lot Help from another person to put on and taking off regular upper body clothing?: A Lot Help from another person to put on and taking off regular lower body clothing?: Total 6 Click Score: 10    End of Session Equipment Utilized During Treatment: Other (comment);Oxygen(Bari Tilt Bed)  OT Visit Diagnosis: Other abnormalities of gait and mobility (R26.89);Unsteadiness on feet (R26.81);Repeated falls (R29.6);Muscle weakness (generalized) (M62.81);History of falling (Z91.81);Other symptoms and signs involving cognitive function;Pain Pain - Right/Left: Left Pain - part of body: Hip;Knee(back)   Activity Tolerance Patient limited by pain   Patient Left in bed;with call bell/phone within reach   Nurse Communication Mobility status        Time: 9150-5697 OT Time Calculation (min): 25 min  Charges: OT General Charges $OT Visit: 1 Visit OT Treatments $Therapeutic Activity: 8-22 mins  Sherryl Manges OTR/L Acute Rehabilitation Services Pager: [phone] Office: [phone]   William Peterson 01/19/2019, 3:55 PM

## 2019-01-19 NOTE — Progress Notes (Signed)
OT Cancellation Note  Patient Details Name: MATTHER DOUPE MRN: [id number] DOB: April 14, 1946   Cancelled Treatment:    Reason Eval/Treat Not Completed: Patient at procedure or test/ unavailable(INTERVENTIONAL RADIOLOGY)  Evern Bio Jaeven Wanzer 01/19/2019, 11:02 AM   Sherryl Manges OTR/L Acute Rehabilitation Services Pager: [phone] Office: [phone]

## 2019-01-19 NOTE — Progress Notes (Signed)
PROGRESS NOTE  JACQUIN BALDEZ WPV:[id number] DOB: 1945-12-16 DOA: 01/14/2019 PCP: Doreen Salvage, PA-C     Brief Narrative: 73 year old male with COPD chronic respiratory failure on 2 L nasal cannula, HTN, CHF (D), recently admitted @ [facility] 3/30-4/3 for COPD exacerbation, COVID 19 NEGATIVE and was discharged home, brought from home after at fall 10 PM 01/13/2019 in the toilet, fell backward was unable to get up.  Was also complaining of right-sided chest pain worse with palpation, in the ER found to have AKI with creatinine 2.5 elevated LFTs, CK of 10,000, troponin 0 0.07, lactic acid 5.2 WBC 20 4K chest x-ray with patchy bibasilar airspace opacities consistent with pneumonia, age indeterminate compression fracture at T6-T7 and was admitted.  his sister on 4/13 states patient has been dealing with leg swelling for 3 months and chronic back pain worse in the past 2 weeks and overall he has not been doing well. Patient's MRI showed acute severe compression fracture of T6, severe muscle strain on the left hip.  Patient continues to have AKI slightly improving,pain on the left hip and back, and remains on broad-spectrum antibiotics for pneumonia.   HPI/Recap of past 24 hours:   3liter urine output last 24hrs, ck continue to trend down, cr continue to improve, hemodynamically stable Laying in bed, c/o back pain,  He has not been out of bed due to back pain reports can control bowel and bladder No cough noticed today Persistent  bilateral lower extremity edema no fever   Assessment/Plan: Principal Problem:   Fall Active Problems:   Acute on chronic diastolic CHF (congestive heart failure) (HCC)   Chronic respiratory failure with hypoxia (HCC)   Pneumonia of both lower lobes due to infectious organism [facility])   COPD (chronic obstructive pulmonary disease) (HCC)   Rhabdomyolysis   Elevated troponin   Acute kidney injury (HCC)   HLD (hyperlipidemia)   Pressure injury of skin   Morbid  obesity with BMI of 50.0-59.9, adult (HCC)  Fall at home -CT head and neck negative for acute intracranial abnormality, negative for cervical spine fracture -Lumbar x-ray negative for acute lumbar spine injury -MRI left hip : extensive muscle strains in the left buttock and left hip and thigh. There are less prominent strains of the right gluteus maximus muscle and adductor muscles -Age indeterminate compression fracture T6, T7 with mild paraspinal hemorrhage associated with T6 -Acute on Chronic back pain pain for 3 months or longer but worse in 2 weeks -MRI w T6 severe compression fracture. Cont pain control oxy and tramadol prn. - IR consulted, possible kyphoplasty under anesthesia early next week, appreciate IR input  Rhabdomyolysis -ck on presentation was 7273, he received ivf, ck trending down  Transaminitis:w Elevated isolated AST , likely in the setting of rhabdomyolysis and heart failure.  improving Ct does concern for underline cirrhosis Also drink alcohol ( reports a little bit) hold statin for now  AKI on CKDII Cr baseline 0.7-0.8 Cr on presentation was 2.75 -Multifactorial from CHF exacerbation/renal hypoperfusion and rhabdomyolysis, nonoliguric and with improving renal function noted overnight.  -nephrology input appreciated, he is started back on his home dose of furosemide 40 mg daily for management of hyperkalemia. -cr continue to improve  HCAP? :  -CT chest without contrast revealed patchy bibasilar airspace opacities with leukocytosis, of note was also on recent steroids from last discharge.   -Had lactic acidosis on admission-sepsis versus related to recent steroids/abnormalities. Suspect from infection.  -Overall hemodynamically stable, afebrile, wbc and lactate downtrending.  --  MRSA screen negative, blood culture NGTD. Repeat CXR on 4/15 "No significant change in mild bibasilar infiltrates" -he does appear volume overloaded, has intermittent congested cough,   sputum culture ordered on 4/15, pending collection -Patient given Zosyn in the emergency room.   vanco/Cefepime started on admission -vanc d/ced on 4/15, continue cefepime for now day 5 of  Total 7 day abx planned. -started mucinex on 4/15, add on incentive spirometer -Followup PA and lateral chest X-ray is recommended in 3-4 weeks following trial of antibiotic therapy to ensure resolution and exclude underlying malignancy -wbc trending down, no fever  Acute on chronic diastolic chf exacerbation -Elevated troponin, mildly + and flat -He had right-sided chest pain, no overt ischemic changes on the EKG. Has reproducible right-sided chest pain likely musculoskeletal.TTE w lvef 55-60% and normal LV and RV size  -he has significant bilateral lower extremity pitting edema on presentation( reports ongoing edema for 45months) - abi unremarkable,  venous US negative for dvt -Diuresing well with lasix, edema has much improved today  -he is off iv lasix, currently on home dose lasix 40mg  daily, monitor bp and cr.  COPD on home o2 -was recently hospitalized for COPD exacerbation  -stable, continue home dulera, albuterol nebs Quick smoking  Cirrhosis/cholelithiasis Incidental findings on CT scan   Morbid obesity  Body mass index is 49.5 kg/m.   FTT: needs snf placement once medically stable  Wound care RN note from 4/13  WOC Nurse wound consult note Patient receiving care in [facility] 3E11.  Assisted with turning, positioning, and cleansing patient of a massive brown, pudding consistency BM by RN and NT.  Mobility aide assisted at conclusion to pull patient up in the [employer] low air loss bed.  Patient states he was sitting on his scrotum for an extended period of time as he could not get up. 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  No wound on the sacrum Pressure Injury POA: Yes Measurements:  The DPTI of the scrotum involves the entire scrotum.  There is scattered peeling of the overlying skin.  The scrotum is purple and maroon. The right posterior thigh DTPI measures 15 cm x 15 cm and joins the scrotal DTPI.  There is some scattered peeling of the overlying skin in spots.  The wound in purple/maroon. The left posterior thigh DTPI is 15 cm x 6 cm and joins the scrotal DTPI.  There is scattered peeling of overlying skin.  The entire wound is purple/maroon. Dressing procedure/placement/frequency: Gerhardt's butt cream for the thigh wounds, 4 times daily.  Xeroform gauze [place] # [phone]) wrapped around the scrotum each shift.  It is not uncommon for DTPIs to evolve into unstageable PIs.  If this occurs, treatment will likely need changing. Monitor the wound area(s) for worsening of condition such as: Signs/symptoms of infection,  Increase in size,  Development of or worsening of odor, Development of pain, or increased pain at the affected locations.  Notify the medical team if any of these develop.  Thank you for the consult.  Discussed plan of care with the patient and bedside nurse.  WOC nurse will not follow at this time.  Please re-consult the WOC team if needed.  Helmut Muster, RN, MSN, CWOCN, CNS-BC, pager [phone]  Code Status: full  Family Communication: patient , sister over the phone  Disposition Plan:  will need snf once medically ready to discharge   Consultants:  Nephrology  for AKI, nephrology signed off on 4/15  IR for  Thoracic 6 vertebroplasty , tentatively early next week under general anesthesia   Procedures:  Thoracic 6 vertebroplasty , tentatively early next week under general anesthesia   Antibiotics:  vanc from admission to 4/15  Cefepime from admission    Objective: BP (!) 110/50 (BP Location: Right Arm)   Pulse 79   Temp 97.7 F (36.5 C) (Oral)   Resp 20   Ht 5\' 10"  (1.778 m)   Wt (!) 156.5 kg   SpO2 100%   BMI 49.50 kg/m    Intake/Output Summary (Last 24 hours) at 01/19/2019 1441 Last data filed at 01/19/2019 0800 Gross per 24 hour  Intake 433.64 ml  Output 2501 ml  Net -2067.36 ml   Filed Weights   01/17/19 0500 01/18/19 0037 01/19/19 0500  Weight: (!) 176.9 kg (!) 178.7 kg (!) 156.5 kg    Exam: Patient is examined daily including today on 01/19/2019, exams remain the same as of yesterday except that has changed    General:  Obese, chronically ill, NAD  Cardiovascular: RRR  Respiratory: occasional rhonchi,  no wheezing, no rales, overall very diminished   Abdomen: Soft/ND/NT, positive BS  Musculoskeletal: bilateral  Lower extremity pitting Edema has much improved  Neuro: alert, oriented   Data Reviewed: Basic Metabolic Panel: Recent Labs  Lab 01/15/19 0508 01/16/19 0722 01/17/19 0556 01/18/19 0501 01/19/19 0502  NA 141 141 141 141 139  K 5.1 4.6 4.4 4.4 4.2  CL 104 104 103 102 100  CO2 22 25 28 28  34*  GLUCOSE 65* 71 99 92 90  BUN 55* 58* 42* 33* 26*  CREATININE 2.75* 2.16* 1.38* 1.30* 1.04  CALCIUM 7.9* 8.1* 8.4* 8.3* 8.5*   Liver Function Tests: Recent Labs  Lab 01/15/19 0508 01/16/19 0722 01/17/19 0556 01/18/19 0501 01/19/19 0502  AST 244* 193* 146* 122* 84*  ALT 39 41 43 45* 37  ALKPHOS 63 62 61 58 52  BILITOT 1.3* 1.2 1.2 1.2 1.2  PROT 4.6* 5.1* 5.0* 5.4* 5.0*  ALBUMIN 2.0* 2.1* 2.0* 2.0* 1.9*   No results for input(s): LIPASE, AMYLASE in the last 168 hours. No results for input(s): AMMONIA in the last 168 hours. CBC: Recent Labs  Lab 01/14/19 1058 01/15/19 0508 01/16/19 0722 01/17/19 0556 01/18/19 0501  WBC 24.0* 18.3* 17.9* 12.4* 11.1*  NEUTROABS 22.6*  --   --   --   --   HGB 13.4 12.7* 13.0 12.2* 12.0*  HCT 45.4 41.1 41.9 40.7 40.1  MCV 95.8 94.9 95.9 94.0 94.4  PLT 291 207 208 228 238   Cardiac Enzymes:   Recent Labs  Lab 01/14/19 1058 01/14/19 1849 01/14/19 2356 01/15/19 0508  01/16/19 2101 01/17/19 0556 01/17/19 1927 01/18/19 0501  01/18/19 0815  CKTOTAL 10,002*  --   --   --    < > 3,213* 2,508* 2,264* 1,790* 1,536*  TROPONINI 0.07* 0.08* 0.05* 0.05*  --   --   --   --   --   --    < > = values in this interval not displayed.   BNP (last 3 results) Recent Labs    01/14/19 1059  BNP 128.3*    ProBNP (last 3 results) No results for input(s): PROBNP in the last 8760 hours.  CBG: No results for input(s): GLUCAP in the last 168 hours.  Recent Results (from the past 240 hour(s))  Blood culture (routine x 2)     Status: None  Collection Time: 01/14/19 11:15 AM  Result Value Ref Range Status   Specimen Description BLOOD RIGHT WRIST  Final   Special Requests   Final    BOTTLES DRAWN AEROBIC AND ANAEROBIC Blood Culture results may not be optimal due to an inadequate volume of blood received in culture bottles   Culture   Final    NO GROWTH 5 DAYS Performed at [facility], [address], [place], [place] [address]    Report Status 01/19/2019 FINAL  Final  Blood culture (routine x 2)     Status: None   Collection Time: 01/14/19  1:25 PM  Result Value Ref Range Status   Specimen Description BLOOD RIGHT WRIST  Final   Special Requests   Final    AEROBIC BOTTLE ONLY Blood Culture results may not be optimal due to an inadequate volume of blood received in culture bottles   Culture   Final    NO GROWTH 5 DAYS Performed at [facility], [address] [place], [place] [address]    Report Status 01/19/2019 FINAL  Final  MRSA PCR Screening     Status: None   Collection Time: 01/14/19  5:37 PM  Result Value Ref Range Status   MRSA by PCR NEGATIVE NEGATIVE Final    Comment:        The GeneXpert MRSA Assay (FDA approved for NASAL specimens only), is one component of a comprehensive MRSA colonization surveillance program. It is not intended to diagnose MRSA infection nor to guide or monitor treatment for MRSA infections. Performed at [facility], [address], [place], [place] [address]       Studies: No results found.  Scheduled Meds: . feeding supplement (ENSURE ENLIVE)  237 mL Oral TID BM  . furosemide  40 mg Oral Daily  . Gerhardt's butt cream   Topical QID  . guaiFENesin  600 mg Oral BID  . heparin  5,000 Units Subcutaneous Q8H  . lidocaine  1 patch Transdermal Q24H  . mometasone-formoterol  2 puff Inhalation BID  . sodium chloride flush  3 mL Intravenous Q12H    Continuous Infusions: . sodium chloride 1,000 mL (01/19/19 1234)  . ceFEPime (MAXIPIME) IV 2 g (01/19/19 1235)     Time spent: , case discussed with interventional radiology over the phone today I have personally reviewed and interpreted on  01/19/2019 daily labs, tele strips, imagings as discussed above under date review session and assessment and plans.  I reviewed all nursing notes, pharmacy notes, consultant notes,  vitals, pertinent old records  I have discussed plan of care as described above with RN , patient and sister on 01/19/2019   Albertine Grates MD, PhD  [facility] Pager [phone]. If 7PM-7AM, please contact night-coverage at [email or web address], password [facility] 01/19/2019, 2:41 PM  LOS: 5 days

## 2019-01-19 NOTE — Progress Notes (Signed)
Referring Physician(s): Dr Janeece FittingF Xu  Supervising Physician: Julieanne Cottoneveshwar, Sanjeev  Patient Status:  [facility]  Chief Complaint:  Severe back pain  Acute T6 fracture   Subjective:  See consult note 01/16/19 Now afeb Completed antibiotic course Back pain persists-- not relieved with meds  Scheduled now for T6 verteboplasty Dr Corliss Skainseveshwar has reviewed imaging and approves procedure   Allergies: Naproxen sodium  Medications: Prior to Admission medications   Medication Sig Start Date End Date Taking? Authorizing Provider  albuterol (PROVENTIL HFA;VENTOLIN HFA) 108 (90 Base) MCG/ACT inhaler Inhale 2 puffs into the lungs every 6 (six) hours as needed for wheezing or shortness of breath.   Yes [provider]  albuterol (PROVENTIL) (2.5 MG/3ML) 0.083% nebulizer solution Take 2.5 mg by nebulization every 6 (six) hours as needed for wheezing or shortness of breath.   Yes [provider]  atorvastatin (LIPITOR) 10 MG tablet Take 10 mg by mouth daily. 08/04/17  Yes [provider]  benzonatate (TESSALON) 100 MG capsule Take 1 capsule (100 mg total) by mouth 3 (three) times daily. 01/05/19  Yes Purohit, Salli QuarryShrey C, MD  carvedilol (COREG) 3.125 MG tablet Take 3.125 mg by mouth 2 (two) times daily. 02/17/18  Yes [provider]  dextromethorphan-guaiFENesin (MUCINEX DM) 30-600 MG 12hr tablet Take 1 tablet by mouth 2 (two) times daily for 30 days. 01/05/19 02/04/19 Yes Purohit, Salli QuarryShrey C, MD  furosemide (LASIX) 20 MG tablet Take 40 mg by mouth daily.   Yes [provider]  mometasone-formoterol (DULERA) 200-5 MCG/ACT AERO Inhale 2 puffs into the lungs 2 (two) times daily for 30 days. 01/05/19 02/04/19 Yes Purohit, Salli QuarryShrey C, MD     Vital Signs: BP (!) 111/43 (BP Location: Right Arm)    Pulse 74    Temp 98 F (36.7 C) (Oral)    Resp 18    Ht 5\' 10"  (1.778 m)    Wt (!) 345 lb (156.5 kg)    SpO2 94%    BMI 49.50 kg/m   Physical Exam Vitals signs reviewed.    Cardiovascular:     Rate and Rhythm: Regular rhythm.  Pulmonary:     Breath sounds: Normal breath sounds.  Abdominal:     Palpations: Abdomen is soft.  Musculoskeletal: Normal range of motion.  Skin:    General: Skin is warm and dry.     Findings: Ecchymosis present.     Comments: Ecchymotic areas over both arms  Neurological:     Mental Status: He is alert and oriented to person, place, and time.  Psychiatric:        Behavior: Behavior normal.     Imaging: Dg Knee 1-2 Views Left  Result Date: 01/15/2019 CLINICAL DATA:  PAIN AND SWELLING POST FALL EXAM: LEFT KNEE - 1-2 VIEW COMPARISON:  None. FINDINGS: Marginal spurs about all 3 compartments of the knee. Mild narrowing of the articular cartilage in the medial compartment. Negative for fracture or dislocation. No definite effusion. Normal alignment. Patchy popliteal arterial calcifications. IMPRESSION: 1. Negative for fracture or other acute bone abnormality. 2. Tricompartmental degenerative change, most marked medially. Electronically Signed   By: Corlis Leak  Hassell M.D.   On: 01/15/2019 11:30   Mr Thoracic Spine Wo Contrast  Result Date: 01/15/2019 CLINICAL DATA:  Mid back pain after a fall last night. Compression fracture of T6. EXAM: MRI THORACIC AND LUMBAR SPINE WITHOUT CONTRAST TECHNIQUE: Multiplanar and multiecho pulse sequences of the thoracic and lumbar spine were obtained without intravenous contrast. COMPARISON:  None. Report of thoracic MRI dated 01/15/2006 FINDINGS: MRI THORACIC SPINE FINDINGS Alignment:  Slight accentuation of the thoracic kyphosis. Vertebrae: Is no acute severe compression fracture of T6. Tiny disc protrusion at T6-7 into the spinal canal touching the ventral aspect of the spinal cord but without spinal cord compression or myelopathy. The fracture of T6 involves the base of both pedicles. No significant paraspinal hematoma. The other thoracic vertebra demonstrate no significant abnormalities. Cord:  No mass lesion or  myelopathy.  No spinal cord compression. Paraspinal and other soft tissues: There is slight edema in posterior paraspinal musculature just to the right of midline at T6-7 consistent with soft tissue injury secondary to flexion injury. Disc levels: The discs throughout the thoracic spine demonstrate no significant abnormality other than a small broad-based disc protrusion at T6-7. MRI LUMBAR SPINE FINDINGS Segmentation:  Standard. Alignment:  Physiologic. Vertebrae: Tiny hemangiomata in the inferior aspects of the T12 and L1 vertebral bodies. No significant bone abnormality. Conus medullaris and cauda equina: Conus extends to the T12-L1 level. Conus and cauda equina appear normal. Paraspinal and other soft tissues: Multiple bilateral renal cysts, left more than right. Otherwise negative. Disc levels: L1-2 and L2-3: Negative. L3-4: Tiny broad-based disc bulge without neural impingement. Minimal degenerative changes of the facet joints. L4-5: Tiny annular tear at the level of the left neural foramen. No disc bulging or protrusion. Moderate bilateral facet arthritis. No neural impingement. L5-S1: Tiny broad-based disc bulge with no neural impingement. Slight degenerative changes of the facet joints. IMPRESSION: MR THORACIC SPINE IMPRESSION 1. Acute severe compression fracture of T6. Tiny broad-based disc protrusion at T6-7 without neural impingement. Slight soft tissue injury to the right of midline in the posterior paraspinal soft tissues at C6-7. 2. No other significant abnormality of the thoracic spine. MR LUMBAR SPINE IMPRESSION No acute or significant abnormality of the lumbar spine. Electronically Signed   By: Francene Boyers M.D.   On: 01/15/2019 15:22   Mr Lumbar Spine Wo Contrast  Result Date: 01/15/2019 CLINICAL DATA:  Mid back pain after a fall last night. Compression fracture of T6. EXAM: MRI THORACIC AND LUMBAR SPINE WITHOUT CONTRAST TECHNIQUE: Multiplanar and multiecho pulse sequences of the thoracic  and lumbar spine were obtained without intravenous contrast. COMPARISON:  None. Report of thoracic MRI dated 01/15/2006 FINDINGS: MRI THORACIC SPINE FINDINGS Alignment:  Slight accentuation of the thoracic kyphosis. Vertebrae: Is no acute severe compression fracture of T6. Tiny disc protrusion at T6-7 into the spinal canal touching the ventral aspect of the spinal cord but without spinal cord compression or myelopathy. The fracture of T6 involves the base of both pedicles. No significant paraspinal hematoma. The other thoracic vertebra demonstrate no significant abnormalities. Cord:  No mass lesion or myelopathy.  No spinal cord compression. Paraspinal and other soft tissues: There is slight edema in posterior paraspinal musculature just to the right of midline at T6-7 consistent with soft tissue injury secondary to flexion injury. Disc levels: The discs throughout the thoracic spine demonstrate no significant abnormality other than a small broad-based disc protrusion at T6-7. MRI LUMBAR SPINE FINDINGS Segmentation:  Standard. Alignment:  Physiologic. Vertebrae: Tiny hemangiomata in the inferior aspects of the T12 and L1 vertebral bodies. No significant bone abnormality. Conus medullaris and cauda equina: Conus extends to the T12-L1 level. Conus and cauda equina appear normal. Paraspinal and other soft tissues: Multiple bilateral renal cysts, left more than right. Otherwise negative. Disc levels: L1-2 and L2-3: Negative. L3-4: Tiny broad-based disc bulge without neural impingement.  Minimal degenerative changes of the facet joints. L4-5: Tiny annular tear at the level of the left neural foramen. No disc bulging or protrusion. Moderate bilateral facet arthritis. No neural impingement. L5-S1: Tiny broad-based disc bulge with no neural impingement. Slight degenerative changes of the facet joints. IMPRESSION: MR THORACIC SPINE IMPRESSION 1. Acute severe compression fracture of T6. Tiny broad-based disc protrusion at T6-7  without neural impingement. Slight soft tissue injury to the right of midline in the posterior paraspinal soft tissues at C6-7. 2. No other significant abnormality of the thoracic spine. MR LUMBAR SPINE IMPRESSION No acute or significant abnormality of the lumbar spine. Electronically Signed   By: Francene Boyers M.D.   On: 01/15/2019 15:22   Mr Hip Left Wo Contrast  Result Date: 01/15/2019 CLINICAL DATA:  Left hip pain after a fall at home last night. EXAM: MR OF THE LEFT HIP WITHOUT CONTRAST TECHNIQUE: Multiplanar, multisequence MR imaging was performed. No intravenous contrast was administered. COMPARISON:  Radiographs dated 01/14/2019 FINDINGS: Bones: There is no fracture or dislocation or significant arthritis of the hips. Pelvic bones are intact. Sacroiliac joints are normal. Articular cartilage and labrum Articular cartilage:  Normal. Labrum:  Intact. Joint or bursal effusion Joint effusion:  Minimal left hip effusion. Bursae: No bursitis. Muscles and tendons Muscles and tendons: There is extensive edema in the left inferior adductor muscles as well as in the inferior aspects of left gluteus maximus muscles as well as in the left hamstring muscles. Hamstring tendons are intact. There is also slight edema in vastus medialis muscle of the left thigh. There is also slight edema in the left gluteus medius muscle. Other findings Miscellaneous:   None IMPRESSION: Extensive muscle strains in the left buttock and left hip and thigh. There are less prominent strains of the right gluteus maximus muscle and adductor muscles No fractures of the left hip or pelvic bones. Electronically Signed   By: Francene Boyers M.D.   On: 01/15/2019 15:28   Dg Chest Port 1 View  Result Date: 01/17/2019 CLINICAL DATA:  Worsening shortness of breath. Pneumonia. Chronic congestive heart failure and chronic respiratory failure with hypoxia. EXAM: PORTABLE CHEST 1 VIEW COMPARISON:  01/14/2019 FINDINGS: Heart size is stable. Low lung  volumes are again seen. Mild bibasilar infiltrates show no significant change. No new or worsening areas of pulmonary opacity are seen. IMPRESSION: No significant change in mild bibasilar infiltrates. Electronically Signed   By: Myles Rosenthal M.D.   On: 01/17/2019 07:49   Vas [place] Vanice Sarah With/wo Tbi  Result Date: 01/15/2019 LOWER EXTREMITY DOPPLER STUDY Indications: Cold, dusky feet.  Performing Technologist: Olen Cordial Rvt  Examination Guidelines: A complete evaluation includes at minimum, Doppler waveform signals and systolic blood pressure reading at the level of bilateral brachial, anterior tibial, and posterior tibial arteries, when vessel segments are accessible. Bilateral testing is considered an integral part of a complete examination. Photoelectric Plethysmograph (PPG) waveforms and toe systolic pressure readings are included as required and additional duplex testing as needed. Limited examinations for reoccurring indications may be performed as noted.  ABI Findings: +--------+------------------+-----+---------+--------+  Right    Rt Pressure (mmHg) Index Waveform  Comment   +--------+------------------+-----+---------+--------+  Brachial 88                       triphasic           +--------+------------------+-----+---------+--------+  PTA      87  0.99  biphasic            +--------+------------------+-----+---------+--------+  DP       70                 0.80  biphasic            +--------+------------------+-----+---------+--------+ +--------+------------------+-----+--------+-----------------------+  Left     Lt Pressure (mmHg) Index Waveform Comment                  +--------+------------------+-----+--------+-----------------------+  Brachial                                   Pt guarding/positioning  +--------+------------------+-----+--------+-----------------------+  PTA      87                 0.99  biphasic                           +--------+------------------+-----+--------+-----------------------+  DP       59                 0.67  biphasic                          +--------+------------------+-----+--------+-----------------------+ +-------+-----------+-----------+------------+------------+  ABI/TBI Today's ABI Today's TBI Previous ABI Previous TBI  +-------+-----------+-----------+------------+------------+  Right   0.99                                               +-------+-----------+-----------+------------+------------+  Left    0.99                                               +-------+-----------+-----------+------------+------------+  Summary: Right: Resting right ankle-brachial index is within normal range. No evidence of significant right lower extremity arterial disease. Unable to obtain TBI due to patient movement. Left: Resting left ankle-brachial index is within normal range. No evidence of significant left lower extremity arterial disease. Unable to obtain TBI due to patient movement.  *See table(s) above for measurements and observations.  Electronically signed by Sherald Hesshristopher Clark MD on 01/15/2019 at 5:18:27 PM.   Final    Vas [place] Lower Extremity Venous (dvt)  Result Date: 01/18/2019  Lower Venous Study Indications: Edema.  Limitations: Body habitus and poor ultrasound/tissue interface. Performing Technologist: Chanda BusingGregory Collins RVT  Examination Guidelines: A complete evaluation includes B-mode imaging, spectral Doppler, color Doppler, and power Doppler as needed of all accessible portions of each vessel. Bilateral testing is considered an integral part of a complete examination. Limited examinations for reoccurring indications may be performed as noted.  +---------+---------------+---------+-----------+----------+--------------+  RIGHT     Compressibility Phasicity Spontaneity Properties Summary         +---------+---------------+---------+-----------+----------+--------------+  CFV       Full            Yes       Yes                                     +---------+---------------+---------+-----------+----------+--------------+  SFJ  Full                                                             +---------+---------------+---------+-----------+----------+--------------+  FV Prox   Full                                                             +---------+---------------+---------+-----------+----------+--------------+  FV Mid    Full                                                             +---------+---------------+---------+-----------+----------+--------------+  FV Distal                 Yes       Yes                                    +---------+---------------+---------+-----------+----------+--------------+  PFV       Full                                                             +---------+---------------+---------+-----------+----------+--------------+  POP       Full            Yes       Yes                                    +---------+---------------+---------+-----------+----------+--------------+  PTV       Full                                                             +---------+---------------+---------+-----------+----------+--------------+  PERO                                                       Not visualized  +---------+---------------+---------+-----------+----------+--------------+   +---------+---------------+---------+-----------+----------+--------------+  LEFT      Compressibility Phasicity Spontaneity Properties Summary         +---------+---------------+---------+-----------+----------+--------------+  CFV       Full            Yes       Yes                                    +---------+---------------+---------+-----------+----------+--------------+  SFJ       Full                                                             +---------+---------------+---------+-----------+----------+--------------+  FV Prox   Full                                                              +---------+---------------+---------+-----------+----------+--------------+  FV Mid                    Yes       Yes                                    +---------+---------------+---------+-----------+----------+--------------+  FV Distal                                                  Not visualized  +---------+---------------+---------+-----------+----------+--------------+  PFV       Full                                                             +---------+---------------+---------+-----------+----------+--------------+  POP       Full            Yes       Yes                                    +---------+---------------+---------+-----------+----------+--------------+  PTV       Full                                                             +---------+---------------+---------+-----------+----------+--------------+  PERO                                                       Not visualized  +---------+---------------+---------+-----------+----------+--------------+     Summary: Right: There is no evidence of deep vein thrombosis in the lower extremity. However, portions of this examination were limited- see technologist comments above. No cystic structure found in the popliteal fossa. Left: There is no evidence of deep vein thrombosis in the lower extremity. However, portions of this examination were limited- see technologist comments above. No cystic structure found in the popliteal fossa.  *See table(s) above for measurements and observations.  Electronically signed by Sherald Hess MD on 01/18/2019 at 4:21:07 PM.    Final     Labs:  CBC: Recent Labs    01/15/19 0508 01/16/19 0722 01/17/19 0556 01/18/19 0501  WBC 18.3* 17.9* 12.4* 11.1*  HGB 12.7* 13.0 12.2* 12.0*  HCT 41.1 41.9 40.7 40.1  PLT 207 208 228 238    COAGS: Recent Labs    01/18/19 1514  INR 1.2    BMP: Recent Labs    01/16/19 0722 01/17/19 0556 01/18/19 0501 01/19/19 0502  NA 141 141 141 139  K 4.6 4.4 4.4 4.2   CL 104 103 102 100  CO2 34*  GLUCOSE 71 99 92 90  BUN 58* 42* 33* 26*  CALCIUM 8.1* 8.4* 8.3* 8.5*  CREATININE 2.16* 1.38* 1.30* 1.04  GFRNONAA 30* 51* 55* >60  GFRAA 34* 59* >60 >60    LIVER FUNCTION TESTS: Recent Labs    01/16/19 0722 01/17/19 0556 01/18/19 0501 01/19/19 0502  BILITOT 1.2 1.2 1.2 1.2  AST 193* 146* 122* 84*  ALT 41 43 45* 37  ALKPHOS 62 61 58 52  PROT 5.1* 5.0* 5.4* 5.0*  ALBUMIN 2.1* 2.0* 2.0* 1.9*    Assessment and Plan:  Severe back pain secondary T6 acute fx Medications without relief Plan for T6 vertebroplasty Risks and benefits of Thoracic 6 vertebroplasty were discussed with the patient including, but not limited to education regarding the natural healing process of compression fractures without intervention, bleeding, infection, cement migration which may cause spinal cord damage, paralysis, pulmonary embolism or even death.  This interventional procedure involves the use of X-rays and because of the nature of the planned procedure, it is possible that we will have prolonged use of X-ray fluoroscopy.  Potential radiation risks to you include (but are not limited to) the following: - A slightly elevated risk for cancer  several years later in life. This risk is typically less than 0.5% percent. This risk is low in comparison to the normal incidence of human cancer, which is 33% for women and 50% for men according to the [employer]. - Radiation induced injury can include skin redness, resembling a rash, tissue breakdown / ulcers and hair loss (which can be temporary or permanent).   The likelihood of either of these occurring depends on the difficulty of the procedure and whether you are sensitive to radiation due to previous procedures, disease, or genetic conditions.   IF your procedure requires a prolonged use of radiation, you will be notified and given written instructions for further action.  It is your responsibility  to monitor the irradiated area for the 2 weeks following the procedure and to notify your physician if you are concerned that you have suffered a radiation induced injury.    All of the patient's questions were answered, patient is agreeable to proceed.  Consent signed and in chart. Electronically Signed: Robet Leu, PA-C 01/19/2019, 10:06 AM   I spent a total of 25 Minutes at the the patient's bedside AND on the patient's hospital floor or unit, greater than 50% of which was counseling/coordinating care for T6 VP

## 2019-01-19 NOTE — Sedation Documentation (Signed)
After xray taken, MD decided the procedure will be unable to be done. Patient transported back to floor. No medications given.

## 2019-01-19 NOTE — Progress Notes (Signed)
Physical Therapy Treatment Patient Details Name: William BarreRobert L Hinkle MRN: [id number] DOB: [birth year] Today's Date: 01/19/2019    History of Present Illness Pt is a 73 y.o. male [facility] with COPD exacerbation (negative COVID) and d/c home, now admitted 01/14/19 after falling back into toilet, unable to get up, also c/o R-side chest pain. Found to have AKI; CXR consistent with PNA. Imaging showed age indeterminate T6-7 compression fx; extensive muscle strains in L buttock, L hip, L thigh; less prominent strains in R gluteus max and adductors. PMH includes COPD (baseline 2L O2 [place]), HTN, CHF.    PT Comments    Pt tilted in bari Vital-Go to 36* for 10 min before pt unable to continue due to pain and soft BP.   Follow Up Recommendations  SNF;Supervision/Assistance - 24 hour     Equipment Recommendations  None recommended by PT    Recommendations for Other Services       Precautions / Restrictions Precautions Precautions: Fall Restrictions Weight Bearing Restrictions: No    Mobility  Bed Mobility               General bed mobility comments: TotalA for repositioning in bed with bed pads, pt not able to assist with BUE/BLEs due to c/o pain  Transfers Overall transfer level: Needs assistance               General transfer comment: Tolerated tilt on bariatric VitalGo tilt bed to 36' for 10 min, BP dropped during session (see vital flow sheets) returned WFL in supine  Ambulation/Gait                 Stairs             Wheelchair Mobility    Modified Rankin (Stroke Patients Only)       Balance                                            Cognition Arousal/Alertness: Awake/alert Behavior During Therapy: WFL for tasks assessed/performed Overall Cognitive Status: No family/caregiver present to determine baseline cognitive functioning                                 General Comments: distracted  by pain, will open eyes on command      Exercises      General Comments General comments (skin integrity, edema, etc.): BP tilted to 36* 78/51 (61), then 77/50/(55)      Pertinent Vitals/Pain Pain Assessment: 0-10 Pain Score: 10-Worst pain ever Faces Pain Scale: Hurts worst Pain Location: L legs and back Pain Descriptors / Indicators: Stabbing;Discomfort;Grimacing;Moaning Pain Intervention(s): Limited activity within patient's tolerance    Home Living                      Prior Function            PT Goals (current goals can now be found in the care plan section) Acute Rehab PT Goals Patient Stated Goal: decrease pain PT Goal Formulation: With patient Time For Goal Achievement: 01/30/19 Potential to Achieve Goals: Fair Progress towards PT goals: Progressing toward goals    Frequency    Min 2X/week      PT Plan Current plan remains appropriate    Co-evaluation PT/OT/SLP Co-Evaluation/Treatment: Yes Reason for  Co-Treatment: Other (comment)(safety with the tilt bed) PT goals addressed during session: Mobility/safety with mobility OT goals addressed during session: ADL's and self-care      AM-PAC PT "6 Clicks" Mobility   Outcome Measure  Help needed turning from your back to your side while in a flat bed without using bedrails?: Total Help needed moving from lying on your back to sitting on the side of a flat bed without using bedrails?: Total Help needed moving to and from a bed to a chair (including a wheelchair)?: Total Help needed standing up from a chair using your arms (e.g., wheelchair or bedside chair)?: Total Help needed to walk in hospital room?: Total Help needed climbing 3-5 steps with a railing? : Total 6 Click Score: 6    End of Session   Activity Tolerance: Patient limited by pain(BP slowly dropping) Patient left: in bed;with call bell/phone within reach Nurse Communication: Mobility status PT Visit Diagnosis: History of falling  (Z91.81);Muscle weakness (generalized) (M62.81);Difficulty in walking, not elsewhere classified (R26.2);Pain Pain - part of body: (ribs)     Time: 1282-0813 PT Time Calculation (min) (ACUTE ONLY): 25 min  Charges:  $Therapeutic Activity: 8-22 mins                     01/19/2019  [place] Bing, PT Acute Rehabilitation Services [phone]  (pager) [phone]  (office)   Eliseo Gum Necole Minassian 01/19/2019, 4:05 PM

## 2019-01-19 NOTE — Plan of Care (Signed)
  Problem: Health Behavior/Discharge Planning: Goal: Ability to manage health-related needs will improve Outcome: Progressing   Problem: Clinical Measurements: Goal: Ability to maintain clinical measurements within normal limits will improve Outcome: Progressing Goal: Will remain free from infection Outcome: Progressing Goal: Diagnostic test results will improve Outcome: Progressing Goal: Respiratory complications will improve Outcome: Progressing Goal: Cardiovascular complication will be avoided Outcome: Progressing   Problem: Activity: Goal: Risk for activity intolerance will decrease Outcome: Progressing   Problem: Elimination: Goal: Will not experience complications related to bowel motility Outcome: Progressing Goal: Will not experience complications related to urinary retention Outcome: Progressing   Problem: Safety: Goal: Ability to remain free from injury will improve Outcome: Progressing   Problem: Skin Integrity: Goal: Risk for impaired skin integrity will decrease Outcome: Progressing   Problem: Activity: Goal: Capacity to carry out activities will improve Outcome: Progressing   Problem: Cardiac: Goal: Ability to achieve and maintain adequate cardiopulmonary perfusion will improve Outcome: Progressing

## 2019-01-19 NOTE — Progress Notes (Signed)
Patient ID: William Peterson, male   DOB: Jul 16, 1946, 73 y.o.   MRN: [id number] INR.  Patient brought to angio suite for potential  T 6 VP/KP for relief of severe pain due to the fracture. Procedure aborted as patient in severe pain and unable to lay still for the procedure to be performed safely. Also patient habitus precluding optimal visualization of the involved vertebral body . D/W referring MD and patients sister. Possibility of attempting under GA discussed with all parties. S.Markanthony Gedney MD

## 2019-01-19 NOTE — Progress Notes (Deleted)
Pt has some blood in urine. MD notified. Will continue to monitor.

## 2019-01-19 NOTE — Progress Notes (Signed)
Patient was taken in bed on 4 L nasal canula, saline lock.  Called IR and spoke to Gorman  Regarding pts sister Kristeen Miss request to call her regarding procedure.

## 2019-01-19 NOTE — Progress Notes (Signed)
Initial Nutrition Assessment  DOCUMENTATION CODES:   Morbid obesity  INTERVENTION:   - Add Ensure Enlive TID (each provides 350 kcal, 20 g protein)   NUTRITION DIAGNOSIS:   Increased nutrient needs related to wound healing, post-op healing as evidenced by estimated needs.  GOAL:   Patient will meet greater than or equal to 90% of their needs  MONITOR:   PO intake, Supplement acceptance, Labs, Skin, Weight trends, I & O's  REASON FOR ASSESSMENT:   Low Braden    ASSESSMENT:   73 yo male, admitted after a fall. PMH significant for CHF, COPD, HTN, RA. Recent admission to [facility] 3/30-4/3 for COPD exacerbation, COVID rule-out.    Per radiology note 4/17, plan for T6 vertebroplasty d/t acute fracture  Pt in IR at time of RD assessment. Eating well on Heart Healthy diet prior to NPO. Will order Ensure Enlive TID to provide additional protein given presence of wounds, upcoming surgery.   Labs: BUN 26 (H) Meds: Lasix   NUTRITION - FOCUSED PHYSICAL EXAM: Deferred - RD working remotely  Diet Order:  No known food allergies 4/12: Heart 4/17: NPO Diet Order            Diet NPO time specified  Diet effective midnight            PO Intake 4/13-4/15: 50-100% x 5 meals recorded 4/16: 0% x 1 meal recorded  EDUCATION NEEDS:   No education needs have been identified at this time  Skin:  Skin Assessment: Skin Integrity Issues: Skin Integrity Issues:: Other (Comment) Other: Ecchymosis - BL arm, leg, abdomen; Excoriated - groin, scrotum; MASD - scrotum, thigh  Last BM:  4/15  Height:  Ht Readings from Last 1 Encounters:  01/14/19 5\' 10"  (1.778 m)    Weight:  Wt Readings :  01/19/19 (!) 156.5 kg  01/02/19 (!) 155.1 kg  02/24/18 147.9 kg (care everywhere)  11/16/13 166.7 kg (care everywhere)  Limited wt hx available Net - 4.3 L fluid since admit, diuresing with Lasix  Ideal Body Weight:  72.7 kg  BMI:  Body mass index is 49.5 kg/m. obese class 3  Estimated  Nutritional Needs:   Kcal:  1825-2190 (25-30 kcal/kg IBW)  Protein:  110-131 gm (1.5-1.8 g/kg IBW)  Fluid:  1 mL/kcal or per MD  Jolaine Artist, MS, RDN, LDN Pager: [phone] Available Mondays and Fridays, 9am-2pm

## 2019-01-19 NOTE — Care Management Important Message (Signed)
Important Message  Patient Details  Name: William Peterson MRN: [id number] Date of Birth: 09/27/1946   Medicare Important Message Given:  Yes    Dorena Bodo 01/19/2019, 3:03 PM

## 2019-01-20 LAB — CBC WITH DIFFERENTIAL/PLATELET
Abs Immature Granulocytes: 0.08 10*3/uL — ABNORMAL HIGH (ref 0.00–0.07)
Basophils Absolute: 0 10*3/uL (ref 0.0–0.1)
Basophils Relative: 0 %
Eosinophils Absolute: 0.1 10*3/uL (ref 0.0–0.5)
Eosinophils Relative: 1 %
HCT: 37.7 % — ABNORMAL LOW (ref 39.0–52.0)
Hemoglobin: 12 g/dL — ABNORMAL LOW (ref 13.0–17.0)
Immature Granulocytes: 1 %
Lymphocytes Relative: 12 %
Lymphs Abs: 1.3 10*3/uL (ref 0.7–4.0)
MCH: 29.6 pg (ref 26.0–34.0)
MCHC: 31.8 g/dL (ref 30.0–36.0)
MCV: 93.1 fL (ref 80.0–100.0)
Monocytes Absolute: 0.8 10*3/uL (ref 0.1–1.0)
Monocytes Relative: 8 %
Neutro Abs: 8.4 10*3/uL — ABNORMAL HIGH (ref 1.7–7.7)
Neutrophils Relative %: 78 %
Platelets: 224 10*3/uL (ref 150–400)
RBC: 4.05 MIL/uL — ABNORMAL LOW (ref 4.22–5.81)
RDW: 14.6 % (ref 11.5–15.5)
WBC: 10.7 10*3/uL — ABNORMAL HIGH (ref 4.0–10.5)
nRBC: 0 % (ref 0.0–0.2)

## 2019-01-20 LAB — GLUCOSE, CAPILLARY: Glucose-Capillary: 114 mg/dL — ABNORMAL HIGH (ref 70–99)

## 2019-01-20 LAB — COMPREHENSIVE METABOLIC PANEL
ALT: 32 U/L (ref 0–44)
AST: 60 U/L — ABNORMAL HIGH (ref 15–41)
Albumin: 1.9 g/dL — ABNORMAL LOW (ref 3.5–5.0)
Alkaline Phosphatase: 51 U/L (ref 38–126)
Anion gap: 11 (ref 5–15)
BUN: 28 mg/dL — ABNORMAL HIGH (ref 8–23)
CO2: 28 mmol/L (ref 22–32)
Calcium: 8.7 mg/dL — ABNORMAL LOW (ref 8.9–10.3)
Chloride: 99 mmol/L (ref 98–111)
Creatinine, Ser: 0.92 mg/dL (ref 0.61–1.24)
GFR calc Af Amer: >60 mL/min (ref >60)
GFR calc non Af Amer: >60 mL/min (ref >60)
Glucose, Bld: 94 mg/dL (ref 70–99)
Potassium: 4.5 mmol/L (ref 3.5–5.1)
Sodium: 138 mmol/L (ref 135–145)
Total Bilirubin: 1.1 mg/dL (ref 0.3–1.2)
Total Protein: 5 g/dL — ABNORMAL LOW (ref 6.5–8.1)

## 2019-01-20 LAB — URINE CULTURE: Culture: NO GROWTH

## 2019-01-20 LAB — MAGNESIUM: Magnesium: 2 mg/dL (ref 1.7–2.4)

## 2019-01-20 MED ORDER — AMOXICILLIN-POT CLAVULANATE 875-125 MG PO TABS
1.0000 | ORAL_TABLET | Freq: Two times a day (BID) | ORAL | Status: AC
  Administered 2019-01-20 – 2019-01-23 (×6): 1 via ORAL
  Filled 2019-01-20 (×6): qty 1

## 2019-01-20 MED ORDER — AMOXICILLIN-POT CLAVULANATE 875-125 MG PO TABS: 1.0000 | ORAL_TABLET | Freq: Two times a day (BID) | ORAL | Status: DC

## 2019-01-20 NOTE — Progress Notes (Signed)
PROGRESS NOTE  William Peterson UTM:[id number] DOB: 1946/03/30 DOA: 01/14/2019 PCP: Egbert Garibaldi, PA-C     Brief Narrative: 73 year old male with COPD chronic respiratory failure on 2 L nasal cannula, HTN, CHF (D), recently admitted @ [facility] 3/30-4/3 for COPD exacerbation, COVID 19 NEGATIVE and was discharged home, brought from home after at fall 10 PM 01/13/2019 in the toilet, fell backward was unable to get up.  Was also complaining of right-sided chest pain worse with palpation, in the ER found to have AKI with creatinine 2.5 elevated LFTs, CK of 10,000, troponin 0 0.07, lactic acid 5.2 WBC 20 4K chest x-ray with patchy bibasilar airspace opacities consistent with pneumonia, age indeterminate compression fracture at T6-T7 and was admitted.  his sister on 4/13 states patient has been dealing with leg swelling for 3 months and chronic back pain worse in the past 2 weeks and overall he has not been doing well. Patient's MRI showed acute severe compression fracture of T6, severe muscle strain on the left hip.  Patient continues to have AKI slightly improving,pain on the left hip and back, and remains on broad-spectrum antibiotics for pneumonia.   HPI/Recap of past 24 hours:   3.2liter urine output last 24hrs,  ck continue to trend down,  cr continue to improve, hemodynamically stable,  lower extremity edema has much improved Laying in bed, c/o back pain, reports can control bowel and bladder  no fever   Assessment/Plan: Principal Problem:   Fall Active Problems:   Acute on chronic diastolic CHF (congestive heart failure) (HCC)   Chronic respiratory failure with hypoxia (HCC)   Pneumonia of both lower lobes due to infectious organism [facility])   COPD (chronic obstructive pulmonary disease) (HCC)   Rhabdomyolysis   Elevated troponin   Acute kidney injury ([place])   HLD (hyperlipidemia)   Pressure injury of skin   Morbid obesity with BMI of 50.0-59.9, adult (HCC)   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 There are less prominent strains of the right gluteus maximus muscle and adductor muscles -Age indeterminate compression fracture T6, T7 with mild paraspinal hemorrhage associated with T6 -Acute on Chronic back pain pain for 3 months or longer but worse in 2 weeks -MRI w T6 severe compression fracture. Cont pain control oxy and tramadol prn. - IR consulted, possible kyphoplasty under anesthesia early next week, appreciate IR input  Rhabdomyolysis -ck on presentation was 7273, he received ivf, ck trending down  Transaminitis:w Elevated isolated AST , tbili/alk phos unremarkable -likely in the setting of rhabdomyolysis and heart failure.   -Ct does concern for underline cirrhosis -Also drink alcohol ( reports a little bit) -hold statin for now improving  AKI on CKDII Cr baseline 0.7-0.8 Cr on presentation was 2.75 -Multifactorial from CHF exacerbation/renal hypoperfusion and rhabdomyolysis, nonoliguric and with improving renal function noted overnight.  -nephrology input appreciated, he is started back on his home dose of furosemide 40 mg daily for management of hyperkalemia. -cr continue to improve  HCAP? :  -CT chest without contrast revealed patchy bibasilar airspace opacities with leukocytosis, of note was also on recent steroids from last discharge.   -Had lactic acidosis on admission-sepsis versus related to recent steroids/abnormalities. Suspect from infection.  -Overall hemodynamically stable, afebrile, wbc and lactate  downtrending.  -- MRSA screen negative, blood culture NGTD. Repeat CXR on 4/15 "No significant change in mild bibasilar infiltrates" -he does appear volume overloaded, has intermittent  congested cough,  sputum culture ordered on 4/15, pending collection -Patient given Zosyn in the emergency room.   vanco/Cefepime started on admission -vanc d/ced on 4/15, cefepime d/ced on 4/18. -started mucinex on 4/15, add on incentive spirometer -Followup PA and lateral chest X-ray is recommended in 3-4 weeks following trial of antibiotic therapy to ensure resolution and exclude underlying malignancy -wbc trending down, no fever  Acute on chronic diastolic chf exacerbation -Elevated troponin, mildly + and flat -He had right-sided chest pain, no overt ischemic changes on the EKG. Has reproducible right-sided chest pain likely musculoskeletal.TTE w lvef 55-60% and normal LV and RV size  -he has significant bilateral lower extremity pitting edema on presentation( reports ongoing edema for 60month) - abi unremarkable,  venous uKoreanegative for dvt-  -he is off iv lasix, currently on home dose lasix 470mdaily, monitor bp and cr. -Diuresing well with lasix, edema has much improved   COPD on home o2 -was recently hospitalized for COPD exacerbation  -stable, continue home dulera, albuterol nebs Quick smoking  Cirrhosis/cholelithiasis Incidental findings on CT scan   Morbid obesity  Body mass index is 49.5 kg/m.   FTT: needs snf placement once medically stable   Sacral /scrotal wound:  Does not appear infected he is already on abx, he was on vanc/cefepime, abx changed to augmentin on 4/18 , plan for another three days Continue wound care.  Wound care RN note from 4/13  WOPort [place]urse wound consult note Patient receiving care in [place] Assisted with turning, positioning, and cleansing patient of a massive brown, pudding consistency BM by RN and NT.  Mobility aide assisted at conclusion to pull patient up in the [facility] low air loss bed.  Patient states he was sitting on his scrotum for an extended period of time as he could not get up. Reason for Consult: sacral wounds Wound type:  There are extensive DTPIs to the scrotum, right posterior/inner thigh and left posterior inner thigh.  No wound on the sacrum Pressure Injury POA: Yes Measurements: The DPTI of the scrotum involves the entire scrotum.  There is scattered peeling of the overlying skin.  The scrotum is purple and maroon. The right posterior thigh DTPI measures 15 cm x 15 cm and joins the scrotal DTPI.  There is some scattered peeling of the overlying skin in spots.  The wound in purple/maroon. The left posterior thigh DTPI is 15 cm x 6 cm and joins the scrotal DTPI.  There is scattered peeling of overlying skin.  The entire wound is purple/maroon. Dressing procedure/placement/frequency: Gerhardt's butt cream for the thigh wounds, 4 times daily.  Xeroform gauze (LKellie Simmering 294) wrapped around the scrotum each shift.  It is not uncommon for DTPIs to evolve into unstageable PIs.  If this occurs, treatment will likely need changing. Monitor the wound area(s) for worsening of condition such as: Signs/symptoms of infection,  Increase in size,  Development of or worsening of odor, Development of pain, or increased pain at the affected locations.  Notify the medical team if any of these develop.  Thank you for the consult.  Discussed plan of care with the patient and bedside nurse.  WOLancasterurse will not follow at this time.  Please re-consult the WO[place]am if needed.  ShVal RilesRN, MSN, CWOCN, CNS-BC, pager [phone]  Status: full  Family Communication: patient , sister over the phone on 4/17  Disposition Plan:  snf after Thoracic 6 vertebroplasty , tentatively early next week under general anesthesia  Has bed offer at [facility]  Consultants:  Nephrology for AKI, nephrology signed off on 4/15  IR for  Thoracic 6 vertebroplasty , tentatively early next week under general anesthesia   Procedures:  Thoracic 6 vertebroplasty , tentatively early next week under general anesthesia    Antibiotics:  vanc from admission to 4/15  Cefepime from admission    Objective: BP 128/62 (BP Location: Right Arm)   Pulse (!) 102   Temp 98.3 F (36.8 C) (Oral)   Resp 20   Ht '5\' 10"'  (1.778 m)   Wt (!) 156.5 kg   SpO2 93%   BMI 49.50 kg/m   Intake/Output Summary (Last 24 hours) at 01/20/2019 1623 Last data filed at 01/20/2019 1314 Gross per 24 hour  Intake 363 ml  Output 2550 ml  Net -2187 ml   Filed Weights   01/17/19 0500 01/18/19 0037 01/19/19 0500  Weight: (!) 176.9 kg (!) 178.7 kg (!) 156.5 kg    Exam: Patient is examined daily including today on 01/20/2019, exams remain the same as of yesterday except that has changed    General:  Obese, chronically ill, NAD  Cardiovascular: RRR  Respiratory: occasional rhonchi,  no wheezing, no rales, overall very diminished   Abdomen: Soft/ND/NT, positive BS  Musculoskeletal: bilateral  Lower extremity pitting Edema has much improved  Neuro: alert, oriented   Data Reviewed: Basic Metabolic Panel: Recent Labs  Lab 01/16/19 0722 01/17/19 0556 01/18/19 0501 01/19/19 0502 01/20/19 0646  NA 141 141 141 139 138  K 4.6 4.4 4.4 4.2 4.5  CL 104 103 102 100 99  CO2 '25 28 28 ' 34* 28  GLUCOSE 71 99 92 90 94  BUN 58* 42* 33* 26* 28*  CREATININE 2.16* 1.38* 1.30* 1.04 0.92  CALCIUM 8.1* 8.4* 8.3* 8.5* 8.7*  MG  --   --   --   --  2.0   Liver Function Tests: Recent Labs  Lab 01/16/19 0722 01/17/19 0556 01/18/19 0501 01/19/19 0502 01/20/19 0646  AST 193* 146* 122* 84* 60*  ALT 41 43 45* 37 32  ALKPHOS 62 61 58 52 51  BILITOT 1.2 1.2 1.2 1.2 1.1  PROT 5.1* 5.0* 5.4* 5.0* 5.0*  ALBUMIN 2.1* 2.0* 2.0* 1.9* 1.9*   No results for input(s): LIPASE, AMYLASE in the last 168 hours. No results for input(s): AMMONIA in the last 168 hours. CBC: Recent Labs  Lab 01/14/19 1058 01/15/19 0508 01/16/19 0722 01/17/19 0556 01/18/19 0501 01/20/19 0646  WBC 24.0* 18.3* 17.9* 12.4* 11.1* 10.7*  NEUTROABS 22.6*  --   --    --   --  8.4*  HGB 13.4 12.7* 13.0 12.2* 12.0* 12.0*  HCT 45.4 41.1 41.9 40.7 40.1 37.7*  MCV 95.8 94.9 95.9 94.0 94.4 93.1  PLT 291 207 208 228 238 224   Cardiac Enzymes:   Recent Labs  Lab 01/14/19 1058 01/14/19 1849 01/14/19 2356 01/15/19 0508  01/16/19 2101 01/17/19 0556 01/17/19 1927 01/18/19 0501 01/18/19 0815  CKTOTAL 10,002*  --   --   --    < > 3,213* 2,508* 2,264* 1,790* 1,536*  TROPONINI 0.07* 0.08* 0.05* 0.05*  --   --   --   --   --   --    < > = values in this interval not displayed.  BNP (last 3 results) Recent Labs    01/14/19 1059  BNP 128.3*    ProBNP (last 3 results) No results for input(s): PROBNP in the last 8760 hours.  CBG: Recent Labs  Lab 01/20/19 1105  GLUCAP 114*    Recent Results (from the past 240 hour(s))  Blood culture (routine x 2)     Status: None   Collection Time: 01/14/19 11:15 AM  Result Value Ref Range Status   Specimen Description BLOOD RIGHT WRIST  Final   Special Requests   Final    BOTTLES DRAWN AEROBIC AND ANAEROBIC Blood Culture results may not be optimal due to an inadequate volume of blood received in culture bottles   Culture   Final    NO GROWTH 5 DAYS Performed at [facility], [place] [address], [place], [place] [address]    Report Status 01/19/2019 FINAL  Final  Blood culture (routine x 2)     Status: None   Collection Time: 01/14/19  1:25 PM  Result Value Ref Range Status   Specimen Description BLOOD RIGHT WRIST  Final   Special Requests   Final    AEROBIC BOTTLE ONLY Blood Culture results may not be optimal due to an inadequate volume of blood received in culture bottles   Culture   Final    NO GROWTH 5 DAYS Performed at [facility], [place] [address], [place], [place] [address]    Report Status 01/19/2019 FINAL  Final  MRSA PCR Screening     Status: None   Collection Time: 01/14/19  5:37 PM  Result Value Ref Range Status   MRSA by PCR NEGATIVE NEGATIVE Final    Comment:        The  GeneXpert MRSA Assay (FDA approved for NASAL specimens only), is one component of a comprehensive MRSA colonization surveillance program. It is not intended to diagnose MRSA infection nor to guide or monitor treatment for MRSA infections. Performed at [facility], [place] [address], [place], [place] [address]      Studies: No results found.  Scheduled Meds: . amoxicillin-clavulanate  1 tablet Oral Q12H  . feeding supplement (ENSURE ENLIVE)  237 mL Oral TID BM  . furosemide  40 mg Oral Daily  . Gerhardt's butt cream   Topical QID  . guaiFENesin  600 mg Oral BID  . heparin  5,000 Units Subcutaneous Q8H  . lidocaine  1 patch Transdermal Q24H  . mometasone-formoterol  2 puff Inhalation BID  . sodium chloride flush  3 mL Intravenous Q12H    Continuous Infusions: . sodium chloride 1,000 mL (01/20/19 1233)     Time spent: 4mns,  I have personally reviewed and interpreted on  01/20/2019 daily labs,  imagings as discussed above under date review session and assessment and plans.  I reviewed all nursing notes, pharmacy notes, consultant notes,  vitals, pertinent old records  I have discussed plan of care as described above with RN , patient  on 01/20/2019   FFlorencia ReasonsMD, PhD  [facility] Pager [phone] If 7PM-7AM, please contact night-coverage at [email or web address], password [facility]4/18/2020, 4:23 PM  LOS: 6 days

## 2019-01-20 NOTE — Plan of Care (Signed)
  Problem: Education: Goal: Knowledge of General Education information will improve Description Including pain rating scale, medication(s)/side effects and non-pharmacologic comfort measures Outcome: Progressing   Problem: Health Behavior/Discharge Planning: Goal: Ability to manage health-related needs will improve Outcome: Progressing   Problem: Clinical Measurements: Goal: Ability to maintain clinical measurements within normal limits will improve Outcome: Progressing Goal: Will remain free from infection Outcome: Progressing Goal: Diagnostic test results will improve Outcome: Progressing Goal: Respiratory complications will improve Outcome: Progressing Goal: Cardiovascular complication will be avoided Outcome: Progressing   Problem: Activity: Goal: Risk for activity intolerance will decrease 01/20/2019 1851 by Electa Sniff, RN Outcome: Progressing 01/20/2019 1849 by Electa Sniff, RN Outcome: Progressing   Problem: Nutrition: Goal: Adequate nutrition will be maintained 01/20/2019 1851 by Electa Sniff, RN Outcome: Progressing 01/20/2019 1849 by Electa Sniff, RN Outcome: Progressing   Problem: Coping: Goal: Level of anxiety will decrease 01/20/2019 1851 by Electa Sniff, RN Outcome: Progressing 01/20/2019 1849 by Electa Sniff, RN Outcome: Progressing    Problem: Pain Managment: Goal: General experience of comfort will improve 01/20/2019 1851 by Electa Sniff, RN Outcome: Progressing Note:  Assessed and monitored patients pain. Pain medication administration as needed. 01/20/2019 1849 by Electa Sniff, RN Outcome: Progressing   Problem: Skin Integrity: Goal: Risk for impaired skin integrity will decrease 01/20/2019 1851 by Electa Sniff, RN Outcome: Progressing Note:  Assess/Monitor skin integrity and appearance. Skin cleaned and wound care. Every 2 hours turned. 01/20/2019 1849 by Electa Sniff, RN Outcome: Progressing

## 2019-01-20 NOTE — Progress Notes (Signed)
This social worker spoke with patient and his aunt [name] [phone] to confirm acceptance of bed offer at [facility]. Patient has officially accepted and his aunt agrees.   [place], [place] Clinical Social Work [phone]

## 2019-01-21 DIAGNOSIS — L89202 Pressure ulcer of unspecified hip, stage 2: Secondary | ICD-10-CM

## 2019-01-21 LAB — CBC WITH DIFFERENTIAL/PLATELET
Abs Immature Granulocytes: 0.11 10*3/uL — ABNORMAL HIGH (ref 0.00–0.07)
Basophils Absolute: 0 10*3/uL (ref 0.0–0.1)
Basophils Relative: 0 %
Eosinophils Absolute: 0.1 10*3/uL (ref 0.0–0.5)
Eosinophils Relative: 1 %
HCT: 39 % (ref 39.0–52.0)
Hemoglobin: 11.8 g/dL — ABNORMAL LOW (ref 13.0–17.0)
Immature Granulocytes: 1 %
Lymphocytes Relative: 10 %
Lymphs Abs: 1 10*3/uL (ref 0.7–4.0)
MCH: 28.3 pg (ref 26.0–34.0)
MCHC: 30.3 g/dL (ref 30.0–36.0)
MCV: 93.5 fL (ref 80.0–100.0)
Monocytes Absolute: 0.9 10*3/uL (ref 0.1–1.0)
Monocytes Relative: 9 %
Neutro Abs: 8.1 10*3/uL — ABNORMAL HIGH (ref 1.7–7.7)
Neutrophils Relative %: 79 %
Platelets: 235 10*3/uL (ref 150–400)
RBC: 4.17 MIL/uL — ABNORMAL LOW (ref 4.22–5.81)
RDW: 14.6 % (ref 11.5–15.5)
WBC: 10.2 10*3/uL (ref 4.0–10.5)
nRBC: 0 % (ref 0.0–0.2)

## 2019-01-21 LAB — COMPREHENSIVE METABOLIC PANEL
ALT: 30 U/L (ref 0–44)
AST: 46 U/L — ABNORMAL HIGH (ref 15–41)
Albumin: 1.9 g/dL — ABNORMAL LOW (ref 3.5–5.0)
Alkaline Phosphatase: 52 U/L (ref 38–126)
Anion gap: 9 (ref 5–15)
BUN: 26 mg/dL — ABNORMAL HIGH (ref 8–23)
CO2: 30 mmol/L (ref 22–32)
Calcium: 8.5 mg/dL — ABNORMAL LOW (ref 8.9–10.3)
Chloride: 98 mmol/L (ref 98–111)
Creatinine, Ser: 0.96 mg/dL (ref 0.61–1.24)
GFR calc Af Amer: >60 mL/min (ref >60)
GFR calc non Af Amer: >60 mL/min (ref >60)
Glucose, Bld: 93 mg/dL (ref 70–99)
Potassium: 4.2 mmol/L (ref 3.5–5.1)
Sodium: 137 mmol/L (ref 135–145)
Total Bilirubin: 1.3 mg/dL — ABNORMAL HIGH (ref 0.3–1.2)
Total Protein: 5.2 g/dL — ABNORMAL LOW (ref 6.5–8.1)

## 2019-01-21 MED ORDER — ACETAMINOPHEN 325 MG PO TABS
650.0000 mg | ORAL_TABLET | Freq: Four times a day (QID) | ORAL | Status: DC
  Administered 2019-01-21 – 2019-01-23 (×10): 650 mg via ORAL
  Filled 2019-01-21 (×10): qty 2

## 2019-01-21 NOTE — Progress Notes (Signed)
Pt refused to be turned, educated on the importance of turning as patient also has multiple wounds in the sacrum and posterior thigh that needs to be cleansed and dressed, but pt still refused.\, will re attempt later.

## 2019-01-21 NOTE — Progress Notes (Signed)
Patient is cleansed, barrier cream and antifungal powder applied on moisture associated skin damage on his groins and abdominal folds. Xeroform dressing on scrotum is changed. Provolon Boots are ordered and applied to float patient heels in order to prevent pressure injury. Very is not compliant with Q2h turns. He is educated about the importance of relieving pressure since he is non mobile. Will continue to monitor.

## 2019-01-21 NOTE — Progress Notes (Signed)
PROGRESS NOTE  William BarreRobert L Peterson ZOX:[id number] DOB: April 27, 1946 DOA: 01/14/2019 PCP: William SalvageBulla, Donald, PA-C   LOS: 7 days   Brief Narrative / Interim history: 73 year old male with COPD on 2 L, HTN, dCHF, [facility], admitted after fall in bathroom @10  PM 01/13/2019.  Reportedly, leg swelling for 3 months, worsening chronic back pain and doing poorly lately per his sister.  In ER, have AKI, elevated LFTs, CK to 10k, troponin 0 0.07, LA 5.2,  WBC 24K, CXR with patchy bibasilar airspace opacities consistent with pneumonia, age indeterminate compression fracture at T6-T7 and was admitted.  Dg back and left hip, lumbar spin, CT head and cervical spine and DG left knee without acute finding but chronic arthritic changes.   CT chest with patchy bibasilar airspace opacities consistent with pneumonia, possibly aspiration, age-indeterminate compression fracture at T6 and T7, possible mild paraspinal hemorrhage associated with T6 fracture suggesting possible activity, emphysema, cirrhosis and cholelithiasis.   MRI thoracic spine showed acute severe compression fracture of T6, T8 a broad-based disc protrusion at T6-T7 without neural impingement .  MRI lumbar spine without acute or significant finding.  T6 vertebroplasty/kyphoplasty attempted by IR on 4/17 but aborted due to patient's inability to lay flat due to severe pain and habitus precluding optimal visualization.  Plan for reattempt under general anesthesia.  Patient's AKI improving.  Elevated liver enzymes resolved.  CK trended down.  Remains on broad-spectrumantibiotics for pneumonia.   Subjective: No major events overnight or this morning.  Complains about the service, pain across his lower chest, in his back and legs and not getting pain medication.  Nasal cannula over his head.  Moaning and restless.  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 -Imaging results as above. -Plan for IR to reattempt vertebroplasty/kyphoplasty under general anesthesia -Rhabdomyolysis resolving -On PRN oxycodone and Dilaudid-won't increase as he only got some -scheduled Tylenol-LFT normalized.  Acute on chronic diastolic CHF exacerbation: Due to fluid resuscitation?.  Echo on 4/13 with EF of 55 to 60% but no other significant structural functional abnormalities. -Continue home Lasix. -Daily weight, intake output and renal function.  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  Also on Lasix.  Creatinine 2.7 on admission and almost back to baseline (0.8).  -Home Lasix resumed. -We will monitor as appropriate.  Transaminitis: Likely due to rhabdomyolysis.  Resolved. -Resume statin  Sepsis/CAP in the setting of recent hospitalization: Sepsis physiology resolved.  Patchy bibasilar opacities noted on CXR and CT chest.  Completed antibiotic course for pneumonia. -Zosyn in ED -Vancomycin 4/12-4/15 -Cefepime 4/12-4/18 -Mucolytic's and incentive spirometry -Repeat PA and lateral CXR in 3 to 4 weeks recommended  COPD with chronic hypoxemia on 2 L at baseline: Stable. -Continue home medications  Cirrhosis/cholelithiasis -Incidental finding  Morbid obesity/FTT: BMI 50 -Encourage lifestyle change to lose weight -Appreciate nutrition input.  History of rheumatoid arthritis: Not on  immunosuppressants  Debility/osteoarthritis/acute T6 fracture/chronic T7 fracture/fall at home -Appreciate PT/OT input-SNF  Pressure injury of the skin/sacral/bilateral posterior thigh/scrotal wound:  POA Per wound care RN:  -There is scattered peeling of the overlying skin.   -The scrotum is purple and maroon.  -The right posterior thigh DTPI measures 15 x 15 cm and joins the scrotal DTPI. There is some scattered peeling of the overlying skin in spots. The wound in purple/maroon.  -The left posterior thigh DTPI is 15 cm x 6 cm and joins the scrotal DTPI. There is scattered peeling of overlying skin. The entire wound is purple/maroon Wound care recommendation:  -Monitor for worsening condition and infection -Gerhardt's butt cream for the thigh wounds, 4 times daily. Xeroform gauze [place] # [phone]) wrapped around the scrotum each shift.  Scheduled Meds: . acetaminophen  650 mg Oral Q6H  . amoxicillin-clavulanate  1 tablet Oral Q12H  . feeding supplement (ENSURE ENLIVE)  237 mL Oral TID BM  . furosemide  40 mg Oral Daily  . Gerhardt's butt cream   Topical QID  . guaiFENesin  600 mg Oral BID  . heparin  5,000 Units Subcutaneous Q8H  . lidocaine  1 patch Transdermal Q24H  . mometasone-formoterol  2 puff Inhalation BID  . sodium chloride flush  3 mL Intravenous Q12H   Continuous Infusions: . sodium chloride Stopped (01/20/19 1400)   PRN Meds:.sodium chloride, albuterol, HYDROmorphone (DILAUDID) injection, ondansetron **OR** ondansetron (ZOFRAN) IV, oxyCODONE, traMADol  DVT prophylaxis: Subcu heparin Code Status: DNR Family Communication: None at bedside Disposition Plan: Remains inpatient for possible vertebroplasty/kyphoplasty by IR.  Final disposition SNF.  Consultants:   IR  Wound care  Procedures:   Attempted IR VT/therapy on 4/17  Antimicrobials:  As above  Objective: Vitals:   01/20/19 1221 01/20/19 2043 01/20/19 2056 01/21/19 0508  BP: 128/62  (!) 107/49  (!) 97/38  Pulse: (!) 102 86 81 84  Resp: 20 (!) 22 20 20   Temp:   98 F (36.7 C) 97.9 F (36.6 C)  TempSrc:   Oral Oral  SpO2: 93% 91% 95% 96%  Weight:      Height:        Intake/Output Summary (Last 24 hours) at 01/21/2019 1500 Last data filed at 01/21/2019 0930 Gross per 24 hour  Intake 240 ml  Output 1000 ml  Net -760 ml   Filed Weights   01/17/19 0500 01/18/19 0037 01/19/19 0500  Weight: (!) 176.9 kg (!) 178.7 kg (!) 156.5 kg    Examination:  GENERAL: Obese.  Chronically ill. Moaning. EYES - vision grossly intact. Sclera anicteric.  NOSE- no gross deformity or drainage.  [place] over his head. LUNGS:  No IWOB.  Fair air movement bilaterally.  Difficult exam due to body habitus. HEART:   RRR. Heart sounds normal. ABD: Bowel sounds present. Soft. Non tender.  Limited exam due to body habitus. MSK/EXT: Moves extremities.  Edema and venous insufficiency bilaterally.  SKIN: As above NEURO: Awake, alert and oriented appropriately.  No gross deficit.  PSYCH: unhappy  Data Reviewed: I have independently reviewed following labs and imaging studies   CBC: Recent Labs  Lab 01/16/19 0722 01/17/19 0556 01/18/19 0501 01/20/19 0646 01/21/19 0529  WBC 17.9* 12.4* 11.1* 10.7* 10.2  NEUTROABS  --   --   --  8.4* 8.1*  HGB 13.0 12.2* 12.0* 12.0* 11.8*  HCT 41.9 40.7 40.1 37.7* 39.0  MCV 95.9 94.0 94.4 93.1 93.5  PLT 208 228 238 224 235   Basic Metabolic Panel: Recent  Labs  Lab 01/17/19 0556 01/18/19 0501 01/19/19 0502 01/20/19 0646 01/21/19 0529  NA 141 141 139 138 137  K 4.4 4.4 4.2 4.5 4.2  CL 103 102 100 99 98  CO2 28 28 34* 28 30  GLUCOSE 99 92 90 94 93  BUN 42* 33* 26* 28* 26*  CREATININE 1.38* 1.30* 1.04 0.92 0.96  CALCIUM 8.4* 8.3* 8.5* 8.7* 8.5*  MG  --   --   --  2.0  --    GFR: Estimated Creatinine Clearance: 106.2 mL/min (by C-G formula based on SCr of 0.96 mg/dL). Liver Function Tests: Recent Labs  Lab 01/17/19 0556 01/18/19 0501 01/19/19 0502  01/20/19 0646 01/21/19 0529  AST 146* 122* 84* 60* 46*  ALT 43 45* 37 32 30  ALKPHOS 61 58 52 51 52  BILITOT 1.2 1.2 1.2 1.1 1.3*  PROT 5.0* 5.4* 5.0* 5.0* 5.2*  ALBUMIN 2.0* 2.0* 1.9* 1.9* 1.9*   No results for input(s): LIPASE, AMYLASE in the last 168 hours. No results for input(s): AMMONIA in the last 168 hours. Coagulation Profile: Recent Labs  Lab 01/18/19 1514  INR 1.2   Cardiac Enzymes: Recent Labs  Lab 01/14/19 1849 01/14/19 2356 01/15/19 0508  01/16/19 2101 01/17/19 0556 01/17/19 1927 01/18/19 0501 01/18/19 0815  CKTOTAL  --   --   --    < > 3,213* 2,508* 2,264* 1,790* 1,536*  TROPONINI 0.08* 0.05* 0.05*  --   --   --   --   --   --    < > = values in this interval not displayed.   BNP (last 3 results) No results for input(s): PROBNP in the last 8760 hours. HbA1C: No results for input(s): HGBA1C in the last 72 hours. CBG: Recent Labs  Lab 01/20/19 1105  GLUCAP 114*   Lipid Profile: No results for input(s): CHOL, HDL, LDLCALC, TRIG, CHOLHDL, LDLDIRECT in the last 72 hours. Thyroid Function Tests: No results for input(s): TSH, T4TOTAL, FREET4, T3FREE, THYROIDAB in the last 72 hours. Anemia Panel: No results for input(s): VITAMINB12, FOLATE, FERRITIN, TIBC, IRON, RETICCTPCT in the last 72 hours. Urine analysis:    Component Value Date/Time   COLORURINE YELLOW 01/18/2019 1557   APPEARANCEUR TURBID (A) 01/18/2019 1557   LABSPEC 1.016 01/18/2019 1557   PHURINE 5.0 01/18/2019 1557   GLUCOSEU NEGATIVE 01/18/2019 1557   HGBUR MODERATE (A) 01/18/2019 1557   BILIRUBINUR NEGATIVE 01/18/2019 1557   KETONESUR NEGATIVE 01/18/2019 1557   PROTEINUR NEGATIVE 01/18/2019 1557   NITRITE NEGATIVE 01/18/2019 1557   LEUKOCYTESUR NEGATIVE 01/18/2019 1557   Sepsis Labs: Invalid input(s): PROCALCITONIN, LACTICIDVEN  Recent Results (from the past 240 hour(s))  Blood culture (routine x 2)     Status: None   Collection Time: 01/14/19 11:15 AM  Result Value Ref Range  Status   Specimen Description BLOOD RIGHT WRIST  Final   Special Requests   Final    BOTTLES DRAWN AEROBIC AND ANAEROBIC Blood Culture results may not be optimal due to an inadequate volume of blood received in culture bottles   Culture   Final    NO GROWTH 5 DAYS Performed at [facility], [address] [place], [place] [address]    Report Status 01/19/2019 FINAL  Final  Blood culture (routine x 2)     Status: None   Collection Time: 01/14/19  1:25 PM  Result Value Ref Range Status   Specimen Description BLOOD RIGHT WRIST  Final   Special Requests  Final    AEROBIC BOTTLE ONLY Blood Culture results may not be optimal due to an inadequate volume of blood received in culture bottles   Culture   Final    NO GROWTH 5 DAYS Performed at [facility], [address] [place], [place] [address]    Report Status 01/19/2019 FINAL  Final  MRSA PCR Screening     Status: None   Collection Time: 01/14/19  5:37 PM  Result Value Ref Range Status   MRSA by PCR NEGATIVE NEGATIVE Final    Comment:        The GeneXpert MRSA Assay (FDA approved for NASAL specimens only), is one component of a comprehensive MRSA colonization surveillance program. It is not intended to diagnose MRSA infection nor to guide or monitor treatment for MRSA infections. Performed at [facility], [address] [place], [place] [address]   Culture, Urine     Status: None   Collection Time: 01/18/19  6:14 PM  Result Value Ref Range Status   Specimen Description URINE, RANDOM  Final   Special Requests NONE  Final   Culture   Final    NO GROWTH Performed at [facility], [address] [place], [place] [address]    Report Status 01/20/2019 FINAL  Final      Radiology Studies: No results found.  Spent 35 minutes reviewing patient's chart including labs, imaging, medications, recommendations by consultant and nursing notes, and formulating assessment and plan as above.  Dajohn Ellender [facility]  [facility] Pager [phone]  If 7PM-7AM, please contact night-coverage [email or web address] Password [email or web address] 01/21/2019, 3:00 PM

## 2019-01-21 NOTE — Progress Notes (Signed)
Pt refused to be turned and have wounds dressed. Pt also refused to allow the provolon boots to be fastened to his feet. PT educated on need to change dressing and leave boots on to prevent further issue. Pt verbalizes understanding. Pt continues to refuse.

## 2019-01-22 ENCOUNTER — Encounter (HOSPITAL_COMMUNITY): Payer: Self-pay

## 2019-01-22 ENCOUNTER — Inpatient Hospital Stay (HOSPITAL_COMMUNITY): Payer: Medicare Other | Admitting: Certified Registered Nurse Anesthetist

## 2019-01-22 ENCOUNTER — Inpatient Hospital Stay (HOSPITAL_COMMUNITY): Payer: Medicare Other

## 2019-01-22 ENCOUNTER — Encounter (HOSPITAL_COMMUNITY)
Admission: EM | Disposition: A | Discharge status: Skilled Nursing Facility | Payer: Self-pay | Source: Home / Self Care | Attending: Internal Medicine

## 2019-01-22 HISTORY — PX: IR VERTEBROPLASTY CERV/THOR BX INC UNI/BIL INC/INJECT/IMAGING: IMG5515

## 2019-01-22 HISTORY — PX: RADIOLOGY WITH ANESTHESIA: SHX6223

## 2019-01-22 LAB — PROTIME-INR
INR: 1.2 (ref 0.8–1.2)
Prothrombin Time: 14.9 seconds (ref 11.4–15.2)

## 2019-01-22 LAB — CK: Total CK: 178 U/L (ref 49–397)

## 2019-01-22 LAB — SURGICAL PCR SCREEN
MRSA, PCR: NEGATIVE
Staphylococcus aureus: NEGATIVE

## 2019-01-22 LAB — APTT: aPTT: 34 seconds (ref 24–36)

## 2019-01-22 SURGERY — RADIOLOGY WITH ANESTHESIA
Anesthesia: General

## 2019-01-22 MED ORDER — PHENYLEPHRINE HCL (PRESSORS) 10 MG/ML IV SOLN
INTRAVENOUS | Status: DC | PRN
  Administered 2019-01-22 (×3): 200 ug via INTRAVENOUS

## 2019-01-22 MED ORDER — FENTANYL CITRATE (PF) 100 MCG/2ML IJ SOLN
INTRAMUSCULAR | Status: AC
  Administered 2019-01-22: 50 ug via INTRAVENOUS
  Filled 2019-01-22: qty 2

## 2019-01-22 MED ORDER — LACTATED RINGERS IV SOLN: INTRAVENOUS | Status: DC

## 2019-01-22 MED ORDER — HEPARIN SODIUM (PORCINE) 5000 UNIT/ML IJ SOLN
5000.0000 [IU] | Freq: Three times a day (TID) | INTRAMUSCULAR | Status: DC
  Administered 2019-01-23 (×2): 5000 [IU] via SUBCUTANEOUS
  Filled 2019-01-22 (×2): qty 1

## 2019-01-22 MED ORDER — SUCCINYLCHOLINE CHLORIDE 20 MG/ML IJ SOLN
INTRAMUSCULAR | Status: DC | PRN
  Administered 2019-01-22: 140 mg via INTRAVENOUS

## 2019-01-22 MED ORDER — EPHEDRINE SULFATE 50 MG/ML IJ SOLN
INTRAMUSCULAR | Status: DC | PRN
  Administered 2019-01-22: 5 mg via INTRAVENOUS

## 2019-01-22 MED ORDER — FENTANYL CITRATE (PF) 250 MCG/5ML IJ SOLN
INTRAMUSCULAR | Status: AC
  Filled 2019-01-22: qty 5

## 2019-01-22 MED ORDER — FENTANYL CITRATE (PF) 100 MCG/2ML IJ SOLN
25.0000 ug | INTRAMUSCULAR | Status: DC | PRN
  Administered 2019-01-22 (×2): 50 ug via INTRAVENOUS

## 2019-01-22 MED ORDER — PROPOFOL 10 MG/ML IV BOLUS
INTRAVENOUS | Status: AC
  Filled 2019-01-22: qty 20

## 2019-01-22 MED ORDER — CEFAZOLIN SODIUM-DEXTROSE 2-4 GM/100ML-% IV SOLN
INTRAVENOUS | Status: AC
  Filled 2019-01-22: qty 100

## 2019-01-22 MED ORDER — SODIUM CHLORIDE 0.9 % IV SOLN: INTRAVENOUS | Status: AC

## 2019-01-22 MED ORDER — SODIUM CHLORIDE 0.9 % IV SOLN
INTRAVENOUS | Status: DC | PRN
  Administered 2019-01-22: 10:00:00 75 ug/min via INTRAVENOUS
  Administered 2019-01-22: 10:00:00 50 ug/min via INTRAVENOUS

## 2019-01-22 MED ORDER — DEXAMETHASONE SODIUM PHOSPHATE 10 MG/ML IJ SOLN
INTRAMUSCULAR | Status: DC | PRN
  Administered 2019-01-22: 5 mg via INTRAVENOUS

## 2019-01-22 MED ORDER — SUGAMMADEX SODIUM 200 MG/2ML IV SOLN
INTRAVENOUS | Status: DC | PRN
  Administered 2019-01-22: 300 mg via INTRAVENOUS

## 2019-01-22 MED ORDER — TOBRAMYCIN SULFATE 1.2 G IJ SOLR
INTRAMUSCULAR | Status: AC
  Filled 2019-01-22: qty 1.2

## 2019-01-22 MED ORDER — PROPOFOL 10 MG/ML IV BOLUS
INTRAVENOUS | Status: DC | PRN
  Administered 2019-01-22: 150 mg via INTRAVENOUS

## 2019-01-22 MED ORDER — ONDANSETRON HCL 4 MG/2ML IJ SOLN: 4.0000 mg | Freq: Once | INTRAMUSCULAR | Status: DC | PRN

## 2019-01-22 MED ORDER — BUPIVACAINE HCL (PF) 0.5 % IJ SOLN
INTRAMUSCULAR | Status: AC
  Administered 2019-01-22: 15 mL
  Filled 2019-01-22: qty 30

## 2019-01-22 MED ORDER — ONDANSETRON HCL 4 MG/2ML IJ SOLN
INTRAMUSCULAR | Status: DC | PRN
  Administered 2019-01-22: 4 mg via INTRAVENOUS

## 2019-01-22 MED ORDER — ROCURONIUM BROMIDE 100 MG/10ML IV SOLN
INTRAVENOUS | Status: DC | PRN
  Administered 2019-01-22: 50 mg via INTRAVENOUS
  Administered 2019-01-22: 20 mg via INTRAVENOUS
  Administered 2019-01-22: 30 mg via INTRAVENOUS

## 2019-01-22 MED ORDER — CEFAZOLIN SODIUM-DEXTROSE 2-3 GM-%(50ML) IV SOLR
INTRAVENOUS | Status: DC | PRN
  Administered 2019-01-22: 2 g via INTRAVENOUS

## 2019-01-22 MED ORDER — GELATIN ABSORBABLE 12-7 MM EX MISC
CUTANEOUS | Status: AC
  Filled 2019-01-22: qty 1

## 2019-01-22 MED ORDER — LIDOCAINE HCL (CARDIAC) PF 100 MG/5ML IV SOSY
PREFILLED_SYRINGE | INTRAVENOUS | Status: DC | PRN
  Administered 2019-01-22: 60 mg via INTRAVENOUS

## 2019-01-22 MED ORDER — IOHEXOL 300 MG/ML  SOLN
50.0000 mL | Freq: Once | INTRAMUSCULAR | Status: AC | PRN
  Administered 2019-01-22: 11:00:00 10 mL via INTRAVENOUS

## 2019-01-22 MED ORDER — FENTANYL CITRATE (PF) 250 MCG/5ML IJ SOLN
INTRAMUSCULAR | Status: DC | PRN
  Administered 2019-01-22: 50 ug via INTRAVENOUS
  Administered 2019-01-22 (×2): 100 ug via INTRAVENOUS

## 2019-01-22 NOTE — Anesthesia Postprocedure Evaluation (Signed)
Anesthesia Post Note  Patient: GREGG MCCATHERN  Procedure(s) Performed: RADIOLOGY WITH ANESTHESIA KYPHOPLASTY (N/A )     Patient location during evaluation: PACU Anesthesia Type: General Level of consciousness: awake Pain management: pain level controlled Vital Signs Assessment: post-procedure vital signs reviewed and stable Respiratory status: spontaneous breathing, nonlabored ventilation, respiratory function stable and patient connected to nasal cannula oxygen Cardiovascular status: blood pressure returned to baseline and stable Postop Assessment: no apparent nausea or vomiting Anesthetic complications: no    Last Vitals:  Vitals:   01/22/19 1432 01/22/19 1503  BP: 137/79 (!) 142/75  Pulse: 85 85  Resp: 17 20  Temp: (!) 36.4 C 36.6 C  SpO2: 94% 97%    Last Pain:  Vitals:   01/22/19 1503  TempSrc: Oral  PainSc:                  Ryan P Ellender

## 2019-01-22 NOTE — Progress Notes (Signed)
Pt educated on NPO status this AM. Pt willing to be turned and cleaned. Pt still refusing to allow staff to dress wounds.

## 2019-01-22 NOTE — Transfer of Care (Signed)
Immediate Anesthesia Transfer of Care Note  Patient: William Peterson  Procedure(s) Performed: RADIOLOGY WITH ANESTHESIA KYPHOPLASTY (N/A )  Patient Location: PACU  Anesthesia Type:General  Level of Consciousness: awake, alert , oriented and patient cooperative  Airway & Oxygen Therapy: Patient Spontanous Breathing and Patient connected to nasal cannula oxygen  Post-op Assessment: Report given to RN and Post -op Vital signs reviewed and stable  Post vital signs: Reviewed and stable  Last Vitals:  Vitals Value Taken Time  BP 131/64 01/22/2019 11:33 AM  Temp    Pulse 82 01/22/2019 11:36 AM  Resp 26 01/22/2019 11:36 AM  SpO2 94 % 01/22/2019 11:36 AM  Vitals shown include unvalidated device data.  Last Pain:  Vitals:   01/22/19 0534  TempSrc: Oral  PainSc:       Patients Stated Pain Goal: 3 (01/22/19 0135)  Complications: No apparent anesthesia complications

## 2019-01-22 NOTE — Progress Notes (Addendum)
PROGRESS NOTE  OLEH KEATH EHO:[id number] DOB: 05-09-1946 DOA: 01/14/2019 PCP: Doreen Salvage, PA-C   LOS: 8 days   Brief Narrative / Interim history: 73 year old male with COPD on 2 L, HTN, dCHF, recently hosp for COPD exac @ [facility] 3/30-4/3, admitted after fall in bathroom @10  PM 01/13/2019.  Reportedly, leg swelling for 3 months, worsening chronic back pain and doing poorly lately per his sister.  In ER, have AKI, elevated LFTs, CK to 10k, troponin 0 0.07, LA 5.2,  WBC 24K, CXR with patchy bibasilar airspace opacities consistent with pneumonia, age indeterminate compression fracture at T6-T7 and was admitted.  Dg back and left hip, lumbar spin, CT head and cervical spine and DG left knee without acute finding but chronic arthritic changes.   CT chest with patchy bibasilar airspace opacities consistent with pneumonia, possibly aspiration, age-indeterminate compression fracture at T6 and T7, possible mild paraspinal hemorrhage associated with T6 fracture suggesting possible activity, emphysema, cirrhosis and cholelithiasis.   MRI thoracic spine showed acute severe compression fracture of T6, T8 a broad-based disc protrusion at T6-T7 without neural impingement .  MRI lumbar spine without acute or significant finding.  T6 vertebroplasty/kyphoplasty attempted by IR on 4/17 but aborted due to patient's inability to lay flat due to severe pain and habitus precluding optimal visualization.  He had T6 vertebroplasty by IR on 4/20 under general anesthesia.  AKI, transaminitis and rhabdomyolysis resolved.  Subjective: No major events overnight of this morning.  Continues to complain severe pain across his lower chest is reproducible with palpation.  Rates his pain severity 9/10.  Reportedly refusing nursing care particularly about the wound care.   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 -Status post successful percutaneous vertebroplasty 4/20. -Imaging results as above. -Rhabdomyolysis-resolved -On PRN oxycodone and Dilaudid-won't increase as he only got some -scheduled Tylenol-LFT normalized.  Acute on chronic diastolic CHF exacerbation: Due to fluid resuscitation?.  Echo on 4/13 with EF of 55 to 60% but no other significant structural functional abnormalities. -Continue home Lasix. -Daily weight, intake output and renal function.  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  Also on Lasix.  Creatinine 2.7 on admission and almost back to baseline (0.8).  -Home Lasix resumed. -We will monitor as appropriate.  Transaminitis: Likely due to rhabdomyolysis.  Resolved. -Resume statin  Sepsis/CAP in the setting of recent hospitalization: sepsis physiology resolved.  Patchy bibasilar opacities noted on CXR and CT chest.  Antibiotic course as below. -Zosyn in ED -Vancomycin 4/12-4/15 -Cefepime 4/12-4/18 -Augmentin 4/18-4/20 -Mucolytic's and incentive spirometry -Repeat PA and lateral CXR in 3 to 4 weeks recommended  COPD with chronic hypoxemia on 2 L at baseline: Stable. -Continue home medications  Cirrhosis/cholelithiasis -Incidental finding  Morbid obesity/FTT: BMI 50 -Encourage lifestyle change to lose weight -Appreciate nutrition input.  History of rheumatoid arthritis: Not on immunosuppressants  Debility/osteoarthritis/acute T6  fracture/chronic T7 fracture/fall at home -Appreciate PT/OT input-SNF  Pressure injury of the skin/sacral/bilateral posterior thigh/scrotal wound:  POA Per wound care RN:  -There is scattered peeling of the overlying skin.   -The scrotum is purple and maroon.  -The right posterior thigh DTPI measures 15 x 15 cm and joins the scrotal DTPI. There is some scattered peeling of the overlying skin in spots. The wound in purple/maroon.  -The left posterior thigh DTPI is 15 cm x 6 cm and joins the scrotal DTPI. There is scattered peeling of overlying skin. The entire wound is purple/maroon Wound care recommendation:  -Monitor for worsening condition and infection -Gerhardt's butt cream for the thigh wounds, 4 times daily. Xeroform gauze [place]([facility] # [phone]) wrapped around the scrotum each shift.  Scheduled Meds: . acetaminophen  650 mg Oral Q6H  . amoxicillin-clavulanate  1 tablet Oral Q12H  . feeding supplement (ENSURE ENLIVE)  237 mL Oral TID BM  . furosemide  40 mg Oral Daily  . gelatin adsorbable      . Gerhardt's butt cream   Topical QID  . guaiFENesin  600 mg Oral BID  . [START ON 01/23/2019] heparin  5,000 Units Subcutaneous Q8H  . lidocaine  1 patch Transdermal Q24H  . mometasone-formoterol  2 puff Inhalation BID  . sodium chloride flush  3 mL Intravenous Q12H  . tobramycin       Continuous Infusions: . sodium chloride 0  (01/20/19 1400)  . sodium chloride    . ceFAZolin Stopped (01/22/19 1304)  . lactated ringers Stopped (01/22/19 1306)   PRN Meds:.sodium chloride, albuterol, HYDROmorphone (DILAUDID) injection, ondansetron **OR** ondansetron (ZOFRAN) IV, oxyCODONE, traMADol  DVT prophylaxis: Subcu heparin Code Status: DNR Family Communication: None at bedside.  Updated patient's daughter and sister over the phone after the procedure. Disposition Plan: As per discharge to SNF on 4/21.  Consultants:   IR  Wound care  Procedures:   Attempted IR VT/therapy on 4/17   Percutaneous vertebroplasty of thoracic spine on 4/20  Antimicrobials:  As above  Objective: Vitals:   01/22/19 1327 01/22/19 1332 01/22/19 1345 01/22/19 1348  BP: (!) 141/80 139/87  (!) 160/88  Pulse: 82 82 85 83  Resp:      Temp:      TempSrc:      SpO2: 97% 94% 93% 95%  Weight:      Height:        Intake/Output Summary (Last 24 hours) at 01/22/2019 1357 Last data filed at 01/22/2019 1116 Gross per 24 hour  Intake 1240 ml  Output 705 ml  Net 535 ml   Filed Weights   01/18/19 0037 01/19/19 0500 01/22/19 0534  Weight: (!) 178.7 kg (!) 156.5 kg (!) 141.5 kg    Examination:  GENERAL: Obese.  Chronically ill.  Morning. HEENT: Vision and Hearing grossly intact.  Creek over his nose. NECK: Supple.  No JVD.  LUNGS:  No IWOB.  Fair air movement bilaterally.  Difficult exam due to body habitus. HEART:  RRR. Heart sounds normal.  ABD: Bowel sounds present. Soft. Non tender.  Limited exam due to body habitus. EXT: Moves extremities.  Heel lift.  Edema and venous insufficiency bilaterally. SKIN: Swelling lower extremity skin color change due to venous insufficiency NEURO: Awake, alert and oriented appropriately.  No gross deficit.  PSYCH: Appears to be in pain.  Data Reviewed: I have independently reviewed following labs and imaging studies   CBC: Recent Labs  Lab 01/16/19 0722 01/17/19 0556 01/18/19 0501 01/20/19 0646 01/21/19 0529  WBC 17.9* 12.4* 11.1* 10.7*  10.2  NEUTROABS  --   --   --  8.4* 8.1*  HGB 13.0 12.2* 12.0* 12.0* 11.8*  HCT 41.9 40.7 40.1 37.7* 39.0  MCV 95.9 94.0 94.4 93.1 93.5  PLT 208 228 238 224 235   Basic Metabolic Panel: Recent Labs  Lab 01/17/19 0556 01/18/19 0501 01/19/19 0502 01/20/19 0646 01/21/19 0529  NA 141 141 139 138 137  K 4.4 4.4 4.2 4.5 4.2  CL 103 102 100 99 98  CO2 28 28 34* 28 30  GLUCOSE 99 92 90 94 93  BUN 42* 33* 26* 28* 26*  CREATININE 1.38* 1.30* 1.04 0.92 0.96  CALCIUM 8.4* 8.3* 8.5* 8.7* 8.5*  MG  --   --   --   2.0  --    GFR: Estimated Creatinine Clearance: 100.2 mL/min (by C-G formula based on SCr of 0.96 mg/dL). Liver Function Tests: Recent Labs  Lab 01/17/19 0556 01/18/19 0501 01/19/19 0502 01/20/19 0646 01/21/19 0529  AST 146* 122* 84* 60* 46*  ALT 43 45* 37 32 30  ALKPHOS 61 58 52 51 52  BILITOT 1.2 1.2 1.2 1.1 1.3*  PROT 5.0* 5.4* 5.0* 5.0* 5.2*  ALBUMIN 2.0* 2.0* 1.9* 1.9* 1.9*   No results for input(s): LIPASE, AMYLASE in the last 168 hours. No results for input(s): AMMONIA in the last 168 hours. Coagulation Profile: Recent Labs  Lab 01/18/19 1514 01/22/19 0814  INR 1.2 1.2   Cardiac Enzymes: Recent Labs  Lab 01/17/19 0556 01/17/19 1927 01/18/19 0501 01/18/19 0815 01/22/19 0814  CKTOTAL 2,508* 2,264* 1,790* 1,536* 178   BNP (last 3 results) No results for input(s): PROBNP in the last 8760 hours. HbA1C: No results for input(s): HGBA1C in the last 72 hours. CBG: Recent Labs  Lab 01/20/19 1105  GLUCAP 114*   Lipid Profile: No results for input(s): CHOL, HDL, LDLCALC, TRIG, CHOLHDL, LDLDIRECT in the last 72 hours. Thyroid Function Tests: No results for input(s): TSH, T4TOTAL, FREET4, T3FREE, THYROIDAB in the last 72 hours. Anemia Panel: No results for input(s): VITAMINB12, FOLATE, FERRITIN, TIBC, IRON, RETICCTPCT in the last 72 hours. Urine analysis:    Component Value Date/Time   COLORURINE YELLOW 01/18/2019 1557   APPEARANCEUR TURBID (A) 01/18/2019 1557   LABSPEC 1.016 01/18/2019 1557   PHURINE 5.0 01/18/2019 1557   GLUCOSEU NEGATIVE 01/18/2019 1557   HGBUR MODERATE (A) 01/18/2019 1557   BILIRUBINUR NEGATIVE 01/18/2019 1557   KETONESUR NEGATIVE 01/18/2019 1557   PROTEINUR NEGATIVE 01/18/2019 1557   NITRITE NEGATIVE 01/18/2019 1557   LEUKOCYTESUR NEGATIVE 01/18/2019 1557   Sepsis Labs: Invalid input(s): PROCALCITONIN, LACTICIDVEN  Recent Results (from the past 240 hour(s))  Blood culture (routine x 2)     Status: None   Collection Time:  01/14/19 11:15 AM  Result Value Ref Range Status   Specimen Description BLOOD RIGHT WRIST  Final   Special Requests   Final    BOTTLES DRAWN AEROBIC AND ANAEROBIC Blood Culture results may not be optimal due to an inadequate volume of blood received in culture bottles   Culture   Final    NO GROWTH 5 DAYS Performed at [facility], [address] [place], [place] [address]    Report Status 01/19/2019 FINAL  Final  Blood culture (routine x 2)     Status: None   Collection Time: 01/14/19  1:25 PM  Result Value Ref Range Status   Specimen Description BLOOD RIGHT WRIST  Final   Special Requests   Final  AEROBIC BOTTLE ONLY Blood Culture results may not be optimal due to an inadequate volume of blood received in culture bottles   Culture   Final    NO GROWTH 5 DAYS Performed at [facility], [address], [place], [place] [address]    Report Status 01/19/2019 FINAL  Final  MRSA PCR Screening     Status: None   Collection Time: 01/14/19  5:37 PM  Result Value Ref Range Status   MRSA by PCR NEGATIVE NEGATIVE Final    Comment:        The GeneXpert MRSA Assay (FDA approved for NASAL specimens only), is one component of a comprehensive MRSA colonization surveillance program. It is not intended to diagnose MRSA infection nor to guide or monitor treatment for MRSA infections. Performed at [facility], [address] [place], [place] [address]   Culture, Urine     Status: None   Collection Time: 01/18/19  6:14 PM  Result Value Ref Range Status   Specimen Description URINE, RANDOM  Final   Special Requests NONE  Final   Culture   Final    NO GROWTH Performed at [facility], [address] [place], [place] [address]    Report Status 01/20/2019 FINAL  Final  Surgical pcr screen     Status: None   Collection Time: 01/22/19  5:38 AM  Result Value Ref Range Status   MRSA, PCR NEGATIVE NEGATIVE Final   Staphylococcus aureus NEGATIVE NEGATIVE Final     Comment: (NOTE) The Xpert SA Assay (FDA approved for NASAL specimens in patients 89 years of age and older), is one component of a comprehensive surveillance program. It is not intended to diagnose infection nor to guide or monitor treatment. Performed at [facility] [place] [facility], [address] [place], [place] [address]       Radiology Studies: No results found.  Desere Gwin T. [facility] Pager [phone]  If 7PM-7AM, please contact night-coverage [email or web address] Password [facility] 01/22/2019, 1:57 PM

## 2019-01-22 NOTE — Anesthesia Procedure Notes (Signed)
Procedure Name: Intubation Date/Time: 01/22/2019 9:56 AM Performed by: Shirlyn Goltz, CRNA Pre-anesthesia Checklist: Patient identified, Emergency Drugs available, Suction available and Patient being monitored Patient Re-evaluated:Patient Re-evaluated prior to induction Oxygen Delivery Method: Circle system utilized Preoxygenation: Pre-oxygenation with 100% oxygen Induction Type: IV induction and Rapid sequence Laryngoscope Size: Mac and 4 Grade View: Grade I Tube type: Oral Tube size: 7.5 mm Number of attempts: 1 Airway Equipment and Method: Stylet Placement Confirmation: ETT inserted through vocal cords under direct vision,  positive ETCO2 and breath sounds checked- equal and bilateral Secured at: 21 cm Tube secured with: Tape Dental Injury: Teeth and Oropharynx as per pre-operative assessment

## 2019-01-22 NOTE — Discharge Summary (Signed)
Physician Discharge Summary  William Peterson ZOX:[id number] DOB: August 29, 1946 DOA: 01/14/2019  PCP: Doreen Salvage, PA-C  Admit date: 01/14/2019 Discharge date: 01/23/2019  Admitted From: Home Disposition: SNF  Recommendations for Outpatient Follow-up:  1. Follow up with PCP in 1-2 weeks 2. Please obtain BMP/CBC at follow-up 3. Please follow up on the following pending results: None  Discharge Condition: Stable CODE STATUS: DNR  Hospital Course: 73 year old male with COPD on 2 L, HTN, dCHF, recently hosp for COPD exac @ [facility] 3/30-4/3, admitted after fall in bathroom @10  PM 01/13/2019.  Reportedly, leg swelling for 3 months, worsening chronic back pain and doing poorly lately per his sister.  In ER, have AKI, elevated LFTs, CK to 10k, troponin 0 0.07, LA 5.2,  WBC 24K, CXR with patchy bibasilar airspace opacities consistent with pneumonia, age indeterminate compression fracture at T6-T7 and was admitted.  Dg back and left hip, lumbar spin, CT head and cervical spine and DG left knee without acute finding but chronic arthritic changes.   CT chest with patchy bibasilar airspace opacities consistent with pneumonia, possibly aspiration, age-indeterminate compression fracture at T6 and T7, possible mild paraspinal hemorrhage associated with T6 fracture suggesting possible activity, emphysema, cirrhosis and cholelithiasis.   MRI thoracic spine showed acute severe compression fracture of T6, T8 a broad-based disc protrusion at T6-T7 without neural impingement .  MRI lumbar spine without acute or significant finding.  T6 vertebroplasty/kyphoplasty attempted by IR on 4/17 but aborted due to patient's inability to lay flat due to severe pain and habitus precluding optimal visualization.  He had T6 vertebroplasty by IR on 4/20 under general anesthesia.  AKI, transaminitis and rhabdomyolysis resolved.  See individual problems list below for more.  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 -Status post successful percutaneous vertebroplasty 01/22/19. -Imaging results as above. -Rhabdomyolysis-resolved -Discharged on scheduled Tylenol, gabapentin, and as needed Robaxin and oxycodone  Acute on chronic diastolic CHF exacerbation: due to fluid resuscitation?.  Echo on 01/15/19 with EF of 55 to 60% but no other significant structural functional abnormalities. -Continue home Lasix. 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  Also on Lasix.  Creatinine 2.7 on admission and down to 0.96.  Baseline 0.8.  -Recheck BMP at follow-up.  Transaminitis: Likely due to rhabdomyolysis.  Resolved.  Sepsis/CAP in the setting of recent hospitalization: sepsis physiology resolved.  Patchy bibasilar opacities noted on CXR and CT chest.  Antibiotic course as below. -Zosyn in ED -Vancomycin 4/12-4/15 -Cefepime 4/12-4/18 -Augmentin 4/18-4/21 -Repeat PA and lateral CXR in 3 to 4 weeks recommended  COPD with chronic hypoxemia on 2 L at baseline: Stable. -Continue home medications -Discharged on home oxygen.  Cirrhosis/cholelithiasis -Incidental finding  Morbid obesity/FTT: BMI 50 -Encourage lifestyle change to lose weight -Nutrition follow-up at facility.  History of rheumatoid  arthritis: Not on immunosuppressants  Debility/osteoarthritis/acute  T6 fracture/chronic T7 fracture/fall at home -PT/OT at least 5 times a week at [facility].  Pressure injury of the skin/sacral/bilateral posterior thigh/scrotal wound:  POA Per wound care RN:             -There is scattered peeling of the overlying skin.              -The scrotum is purple and maroon.             -The right posterior thigh DTPI measures 15 x 15 cm and joins the scrotal DTPI. There is some scattered peeling of the overlying skin in spots. The wound in purple/maroon.             -The left posterior thigh DTPI is 15 cm x 6 cm and joins the scrotal DTPI. There is scattered peeling of overlying skin. The entire wound is purple/maroon Wound care recommendations:  -Monitor for worsening condition and infection -Gerhardt's butt cream for the thigh wounds, 4 times daily. Xeroform gauze [place] # [phone]) wrapped around the scrotum each shift. - May consider foley if wound worse  Discharge Instructions  Discharge Instructions    Diet - low sodium heart healthy   Complete by:  As directed    Diet - low sodium heart healthy   Complete by:  As directed    Increase activity slowly   Complete by:  As directed    Increase activity slowly   Complete by:  As directed      Allergies as of 01/23/2019      Reactions   Naproxen Sodium Rash      Medication List    STOP taking these medications   benzonatate 100 MG capsule Commonly known as:  TESSALON     TAKE these medications   acetaminophen 325 MG tablet Commonly known as:  TYLENOL Take 2 tablets (650 mg total) by mouth every 8 (eight) hours.   albuterol 108 (90 Base) MCG/ACT inhaler Commonly known as:  VENTOLIN HFA Inhale 2 puffs into the lungs every 6 (six) hours as needed for wheezing or shortness of breath.   albuterol (2.5 MG/3ML) 0.083% nebulizer solution Commonly known as:  PROVENTIL Take 2.5 mg by nebulization every 6 (six) hours as needed for  wheezing or shortness of breath.   atorvastatin 10 MG tablet Commonly known as:  LIPITOR Take 10 mg by mouth daily.   carvedilol 3.125 MG tablet Commonly known as:  COREG Take 3.125 mg by mouth 2 (two) times daily.   dextromethorphan-guaiFENesin 30-600 MG 12hr tablet Commonly known as:  MUCINEX DM Take 1 tablet by mouth 2 (two) times daily for 30 days.   feeding supplement (ENSURE ENLIVE) Liqd Take 237 mLs by mouth 3 (three) times daily between meals.   furosemide 20 MG tablet Commonly known as:  LASIX Take 40 mg by mouth daily.   gabapentin 300 MG capsule Commonly known as:  NEURONTIN Take 1 capsule (300 mg total) by mouth 3 (three) times daily.   Gerhardt's butt cream Crea Apply 1 application topically 4 (four) times daily.   guaiFENesin 600 MG 12 hr tablet Commonly known as:  MUCINEX Take 1 tablet (600 mg total) by mouth 2 (two) times daily.   methocarbamol 500 MG tablet Commonly known as:  ROBAXIN Take 1 tablet (500 mg total) by mouth every 8 (eight) hours as needed for muscle spasms.   mometasone-formoterol 200-5 MCG/ACT Aero Commonly known as:  DULERA Inhale 2 puffs into the lungs 2 (two) times daily for  30 days.   Oxycodone HCl 10 MG Tabs Take 1 tablet (10 mg total) by mouth every 6 (six) hours as needed for severe pain.      Follow-up Information    [place], Opal Sidles. Schedule an appointment as soon as possible for a visit in 3 week(s).   Specialty:  Internal Medicine Contact information: [address] [place] [address] [phone]           Consultations:  Neuro interventional radiology  Procedures/Studies: 2D Echo:  1. The left ventricle has normal systolic function, with an ejection fraction of 55-60%. The cavity size was normal. There is moderately increased left ventricular wall thickness. Left ventricular diastolic function could not be evaluated.  2. The right ventricle has normal systolc function. The cavity was normal. There  is no increase in right ventricular wall thickness. 3. Mild thickening of the aortic valve Mild calcification of the aortic valve. Aortic valve regurgitation is mild by color flow Doppler.  Dg Chest 2 View  Result Date: 01/14/2019 CLINICAL DATA:  Dyspnea EXAM: CHEST - 2 VIEW COMPARISON:  01/01/2019 chest radiograph. FINDINGS: Low lung volumes. Stable cardiomediastinal silhouette with mild cardiomegaly. No pneumothorax. Patchy opacity at both lung bases. No overt pulmonary edema. IMPRESSION: Low lung volumes. Patchy bibasilar lung opacities, differential includes aspiration, pneumonia or atelectasis. Chest radiograph follow-up advised. Electronically Signed   By: Delbert Phenix M.D.   On: 01/14/2019 12:24   Dg Lumbar Spine Complete  Result Date: 01/14/2019 CLINICAL DATA:  Fall last night.  Back and left hip pain. EXAM: LUMBAR SPINE - COMPLETE 4+ VIEW COMPARISON:  Lumbar spine CT 09/18/2008. FINDINGS: There are 5 lumbar type vertebral bodies. The bones are mildly demineralized. No evidence of acute fracture or pars defect. The disc spaces are preserved. Moderate facet degenerative changes are present inferiorly. There are bilateral sacroiliac degenerative changes. There is diffuse aortic atherosclerosis. IMPRESSION: No evidence of acute lumbar spine injury. Facet arthropathy and aortic atherosclerosis noted. Electronically Signed   By: Carey Bullocks M.D.   On: 01/14/2019 12:30   Dg Knee 1-2 Views Left  Result Date: 01/15/2019 CLINICAL DATA:  PAIN AND SWELLING POST FALL EXAM: LEFT KNEE - 1-2 VIEW COMPARISON:  None. FINDINGS: Marginal spurs about all 3 compartments of the knee. Mild narrowing of the articular cartilage in the medial compartment. Negative for fracture or dislocation. No definite effusion. Normal alignment. Patchy popliteal arterial calcifications. IMPRESSION: 1. Negative for fracture or other acute bone abnormality. 2. Tricompartmental degenerative change, most marked medially.  Electronically Signed   By: Corlis Leak M.D.   On: 01/15/2019 11:30   Ct Head Wo Contrast  Result Date: 01/14/2019 CLINICAL DATA:  Head trauma, headache EXAM: CT HEAD WITHOUT CONTRAST CT CERVICAL SPINE WITHOUT CONTRAST TECHNIQUE: Multidetector CT imaging of the head and cervical spine was performed following the standard protocol without intravenous contrast. Multiplanar CT image reconstructions of the cervical spine were also generated. COMPARISON:  None. FINDINGS: CT HEAD FINDINGS Brain: Generalized atrophy without hydrocephalus. Diffuse white matter hypodensity bilaterally appears chronic. Negative for acute infarct, hemorrhage, or mass. No midline shift. Vascular: Negative for hyperdense vessel Skull: Negative Sinuses/Orbits: Mucosal edema paranasal sinuses. Bilateral cataract surgery. Other: None CT CERVICAL SPINE FINDINGS Alignment: Normal alignment Image quality degraded by patient motion. Skull base and vertebrae: Negative for fracture Soft tissues and spinal canal: Atherosclerotic calcification in the carotid artery bilaterally. Negative for soft tissue mass Disc levels: Mild disc and facet degeneration in the cervical spine. Upper chest: Lung  apices clear bilaterally. Other: None IMPRESSION: 1. No acute intracranial abnormality. Atrophy with chronic white matter changes 2. Negative for cervical spine fracture.  Mild degenerative changes. Electronically Signed   By: Marlan Palau M.D.   On: 01/14/2019 14:05   Ct Chest Wo Contrast  Result Date: 01/14/2019 CLINICAL DATA:  Right chest pain since falling last night. History of CHF, COPD and rheumatoid arthritis. EXAM: CT CHEST WITHOUT CONTRAST TECHNIQUE: Multidetector CT imaging of the chest was performed following the standard protocol without IV contrast. COMPARISON:  Chest radiographs today and 01/01/2019. Report only from chest CT 06/10/2005. FINDINGS: Cardiovascular: Moderate atherosclerosis of the aorta, great vessels and coronary arteries. No  acute vascular findings on noncontrast imaging. The heart size is normal. There is no pericardial effusion. Mediastinum/Nodes: There are no enlarged mediastinal, hilar or axillary lymph nodes.Hilar assessment is limited by the lack of intravenous contrast. The thyroid gland, trachea and esophagus demonstrate no significant findings. Lungs/Pleura: There is no pleural effusion or pneumothorax. Mild centrilobular emphysema is present. There are patchy airspace opacities at both lung bases which are likely inflammatory. There is no consolidation or suspicious pulmonary nodule. There is a 6 mm perifissural nodule along the right middle lobe on image 69/5 which is likely a lymph node. Upper abdomen: Diffuse contour irregularity of the liver suspicious for cirrhosis. The liver also demonstrates decreased density consistent with steatosis. No focal lesion identified on noncontrast imaging. There are multiple layering calcified gallstones. There is a probable cyst posteriorly in the mid left kidney, incompletely visualized. Musculoskeletal/Chest wall: There are anterior compression deformities at T6 and T7 which are age indeterminate, not previously imaged. The T6 fracture results in greater than 50% loss vertebral body height, and there may be mild paraspinal hemorrhage at this level suggesting acuity. IMPRESSION: 1. Patchy bibasilar airspace opacities, consistent with pneumonia, possibly on the basis of aspiration. Followup PA and lateral chest X-ray is recommended in 3-4 weeks following trial of antibiotic therapy to ensure resolution and exclude underlying malignancy. 2. Age-indeterminate compression fractures at T6 and T7. There may be mild paraspinal hemorrhage associated with the T6 fracture, suggesting possible acuity. 3. Aortic Atherosclerosis (ICD10-I70.0) and Emphysema (ICD10-J43.9). 4. Contour irregularity of the liver suspicious for cirrhosis. Cholelithiasis. Electronically Signed   By: Carey Bullocks M.D.    On: 01/14/2019 14:14   Ct Cervical Spine Wo Contrast  Result Date: 01/14/2019 CLINICAL DATA:  Head trauma, headache EXAM: CT HEAD WITHOUT CONTRAST CT CERVICAL SPINE WITHOUT CONTRAST TECHNIQUE: Multidetector CT imaging of the head and cervical spine was performed following the standard protocol without intravenous contrast. Multiplanar CT image reconstructions of the cervical spine were also generated. COMPARISON:  None. FINDINGS: CT HEAD FINDINGS Brain: Generalized atrophy without hydrocephalus. Diffuse white matter hypodensity bilaterally appears chronic. Negative for acute infarct, hemorrhage, or mass. No midline shift. Vascular: Negative for hyperdense vessel Skull: Negative Sinuses/Orbits: Mucosal edema paranasal sinuses. Bilateral cataract surgery. Other: None CT CERVICAL SPINE FINDINGS Alignment: Normal alignment Image quality degraded by patient motion. Skull base and vertebrae: Negative for fracture Soft tissues and spinal canal: Atherosclerotic calcification in the carotid artery bilaterally. Negative for soft tissue mass Disc levels: Mild disc and facet degeneration in the cervical spine. Upper chest: Lung apices clear bilaterally. Other: None IMPRESSION: 1. No acute intracranial abnormality. Atrophy with chronic white matter changes 2. Negative for cervical spine fracture.  Mild degenerative changes. Electronically Signed   By: Marlan Palau M.D.   On: 01/14/2019 14:05   Mr Thoracic Spine Wo Contrast  Result Date: 01/15/2019 CLINICAL DATA:  Mid back pain after a fall last night. Compression fracture of T6. EXAM: MRI THORACIC AND LUMBAR SPINE WITHOUT CONTRAST TECHNIQUE: Multiplanar and multiecho pulse sequences of the thoracic and lumbar spine were obtained without intravenous contrast. COMPARISON:  None. Report of thoracic MRI dated 01/15/2006 FINDINGS: MRI THORACIC SPINE FINDINGS Alignment:  Slight accentuation of the thoracic kyphosis. Vertebrae: Is no acute severe compression fracture of T6.  Tiny disc protrusion at T6-7 into the spinal canal touching the ventral aspect of the spinal cord but without spinal cord compression or myelopathy. The fracture of T6 involves the base of both pedicles. No significant paraspinal hematoma. The other thoracic vertebra demonstrate no significant abnormalities. Cord:  No mass lesion or myelopathy.  No spinal cord compression. Paraspinal and other soft tissues: There is slight edema in posterior paraspinal musculature just to the right of midline at T6-7 consistent with soft tissue injury secondary to flexion injury. Disc levels: The discs throughout the thoracic spine demonstrate no significant abnormality other than a small broad-based disc protrusion at T6-7. MRI LUMBAR SPINE FINDINGS Segmentation:  Standard. Alignment:  Physiologic. Vertebrae: Tiny hemangiomata in the inferior aspects of the T12 and L1 vertebral bodies. No significant bone abnormality. Conus medullaris and cauda equina: Conus extends to the T12-L1 level. Conus and cauda equina appear normal. Paraspinal and other soft tissues: Multiple bilateral renal cysts, left more than right. Otherwise negative. Disc levels: L1-2 and L2-3: Negative. L3-4: Tiny broad-based disc bulge without neural impingement. Minimal degenerative changes of the facet joints. L4-5: Tiny annular tear at the level of the left neural foramen. No disc bulging or protrusion. Moderate bilateral facet arthritis. No neural impingement. L5-S1: Tiny broad-based disc bulge with no neural impingement. Slight degenerative changes of the facet joints. IMPRESSION: MR THORACIC SPINE IMPRESSION 1. Acute severe compression fracture of T6. Tiny broad-based disc protrusion at T6-7 without neural impingement. Slight soft tissue injury to the right of midline in the posterior paraspinal soft tissues at C6-7. 2. No other significant abnormality of the thoracic spine. MR LUMBAR SPINE IMPRESSION No acute or significant abnormality of the lumbar spine.  Electronically Signed   By: Francene Boyers M.D.   On: 01/15/2019 15:22   Mr Lumbar Spine Wo Contrast  Result Date: 01/15/2019 CLINICAL DATA:  Mid back pain after a fall last night. Compression fracture of T6. EXAM: MRI THORACIC AND LUMBAR SPINE WITHOUT CONTRAST TECHNIQUE: Multiplanar and multiecho pulse sequences of the thoracic and lumbar spine were obtained without intravenous contrast. COMPARISON:  None. Report of thoracic MRI dated 01/15/2006 FINDINGS: MRI THORACIC SPINE FINDINGS Alignment:  Slight accentuation of the thoracic kyphosis. Vertebrae: Is no acute severe compression fracture of T6. Tiny disc protrusion at T6-7 into the spinal canal touching the ventral aspect of the spinal cord but without spinal cord compression or myelopathy. The fracture of T6 involves the base of both pedicles. No significant paraspinal hematoma. The other thoracic vertebra demonstrate no significant abnormalities. Cord:  No mass lesion or myelopathy.  No spinal cord compression. Paraspinal and other soft tissues: There is slight edema in posterior paraspinal musculature just to the right of midline at T6-7 consistent with soft tissue injury secondary to flexion injury. Disc levels: The discs throughout the thoracic spine demonstrate no significant abnormality other than a small broad-based disc protrusion at T6-7. MRI LUMBAR SPINE FINDINGS Segmentation:  Standard. Alignment:  Physiologic. Vertebrae: Tiny hemangiomata in the inferior aspects of the T12 and L1 vertebral bodies. No significant bone abnormality. Conus  medullaris and cauda equina: Conus extends to the T12-L1 level. Conus and cauda equina appear normal. Paraspinal and other soft tissues: Multiple bilateral renal cysts, left more than right. Otherwise negative. Disc levels: L1-2 and L2-3: Negative. L3-4: Tiny broad-based disc bulge without neural impingement. Minimal degenerative changes of the facet joints. L4-5: Tiny annular tear at the level of the left neural  foramen. No disc bulging or protrusion. Moderate bilateral facet arthritis. No neural impingement. L5-S1: Tiny broad-based disc bulge with no neural impingement. Slight degenerative changes of the facet joints. IMPRESSION: MR THORACIC SPINE IMPRESSION 1. Acute severe compression fracture of T6. Tiny broad-based disc protrusion at T6-7 without neural impingement. Slight soft tissue injury to the right of midline in the posterior paraspinal soft tissues at C6-7. 2. No other significant abnormality of the thoracic spine. MR LUMBAR SPINE IMPRESSION No acute or significant abnormality of the lumbar spine. Electronically Signed   By: Francene Boyers M.D.   On: 01/15/2019 15:22   Mr Hip Left Wo Contrast  Result Date: 01/15/2019 CLINICAL DATA:  Left hip pain after a fall at home last night. EXAM: MR OF THE LEFT HIP WITHOUT CONTRAST TECHNIQUE: Multiplanar, multisequence MR imaging was performed. No intravenous contrast was administered. COMPARISON:  Radiographs dated 01/14/2019 FINDINGS: Bones: There is no fracture or dislocation or significant arthritis of the hips. Pelvic bones are intact. Sacroiliac joints are normal. Articular cartilage and labrum Articular cartilage:  Normal. Labrum:  Intact. Joint or bursal effusion Joint effusion:  Minimal left hip effusion. Bursae: No bursitis. Muscles and tendons Muscles and tendons: There is extensive edema in the left inferior adductor muscles as well as in the inferior aspects of left gluteus maximus muscles as well as in the left hamstring muscles. Hamstring tendons are intact. There is also slight edema in vastus medialis muscle of the left thigh. There is also slight edema in the left gluteus medius muscle. Other findings Miscellaneous:   None IMPRESSION: Extensive muscle strains in the left buttock and left hip and thigh. There are less prominent strains of the right gluteus maximus muscle and adductor muscles No fractures of the left hip or pelvic bones. Electronically  Signed   By: Francene Boyers M.D.   On: 01/15/2019 15:28   Dg Chest Port 1 View  Result Date: 01/17/2019 CLINICAL DATA:  Worsening shortness of breath. Pneumonia. Chronic congestive heart failure and chronic respiratory failure with hypoxia. EXAM: PORTABLE CHEST 1 VIEW COMPARISON:  01/14/2019 FINDINGS: Heart size is stable. Low lung volumes are again seen. Mild bibasilar infiltrates show no significant change. No new or worsening areas of pulmonary opacity are seen. IMPRESSION: No significant change in mild bibasilar infiltrates. Electronically Signed   By: Myles Rosenthal M.D.   On: 01/17/2019 07:49   Vas [place] Vanice Sarah With/wo Tbi  Result Date: 01/15/2019 LOWER EXTREMITY DOPPLER STUDY Indications: Cold, dusky feet.  Performing Technologist: Olen Cordial Rvt  Examination Guidelines: A complete evaluation includes at minimum, Doppler waveform signals and systolic blood pressure reading at the level of bilateral brachial, anterior tibial, and posterior tibial arteries, when vessel segments are accessible. Bilateral testing is considered an integral part of a complete examination. Photoelectric Plethysmograph (PPG) waveforms and toe systolic pressure readings are included as required and additional duplex testing as needed. Limited examinations for reoccurring indications may be performed as noted.  ABI Findings: +--------+------------------+-----+---------+--------+  Right    Rt Pressure (mmHg) Index Waveform  Comment   +--------+------------------+-----+---------+--------+  Brachial 88  triphasic           +--------+------------------+-----+---------+--------+  PTA      87                 0.99  biphasic            +--------+------------------+-----+---------+--------+  DP       70                 0.80  biphasic            +--------+------------------+-----+---------+--------+ +--------+------------------+-----+--------+-----------------------+  Left     Lt Pressure (mmHg) Index Waveform Comment                   +--------+------------------+-----+--------+-----------------------+  Brachial                                   Pt guarding/positioning  +--------+------------------+-----+--------+-----------------------+  PTA      87                 0.99  biphasic                          +--------+------------------+-----+--------+-----------------------+  DP       59                 0.67  biphasic                          +--------+------------------+-----+--------+-----------------------+ +-------+-----------+-----------+------------+------------+  ABI/TBI Today's ABI Today's TBI Previous ABI Previous TBI  +-------+-----------+-----------+------------+------------+  Right   0.99                                               +-------+-----------+-----------+------------+------------+  Left    0.99                                               +-------+-----------+-----------+------------+------------+  Summary: Right: Resting right ankle-brachial index is within normal range. No evidence of significant right lower extremity arterial disease. Unable to obtain TBI due to patient movement. Left: Resting left ankle-brachial index is within normal range. No evidence of significant left lower extremity arterial disease. Unable to obtain TBI due to patient movement.  *See table(s) above for measurements and observations.  Electronically signed by Sherald Hesshristopher Clark MD on 01/15/2019 at 5:18:27 PM.   Final    Dg Hip Unilat W Or Wo Pelvis 2-3 Views Left  Result Date: 01/14/2019 CLINICAL DATA:  Fall last night.  Back and left hip pain. EXAM: DG HIP (WITH OR WITHOUT PELVIS) 2-3V LEFT COMPARISON:  None. FINDINGS: Examination is limited by body habitus and positioning. The bones appear demineralized. No definite evidence of acute fracture or dislocation. However, the left femoral neck is suboptimally visualized. Mild degenerative changes of the hips, sacroiliac joints and lower lumbar spine. IMPRESSION: No definite acute  osseous findings. Evaluation of the left hip is limited by body habitus and positioning. Consider CT for more definitive evaluation. Electronically Signed   By: Carey BullocksWilliam  Veazey M.D.   On: 01/14/2019 12:28   Vas Koreas Lower Extremity Venous (dvt)  Result Date: 01/18/2019  Lower  Venous Study Indications: Edema.  Limitations: Body habitus and poor ultrasound/tissue interface. Performing Technologist: Chanda Busing RVT  Examination Guidelines: A complete evaluation includes B-mode imaging, spectral Doppler, color Doppler, and power Doppler as needed of all accessible portions of each vessel. Bilateral testing is considered an integral part of a complete examination. Limited examinations for reoccurring indications may be performed as noted.  +---------+---------------+---------+-----------+----------+--------------+  RIGHT     Compressibility Phasicity Spontaneity Properties Summary         +---------+---------------+---------+-----------+----------+--------------+  CFV       Full            Yes       Yes                                    +---------+---------------+---------+-----------+----------+--------------+  SFJ       Full                                                             +---------+---------------+---------+-----------+----------+--------------+  FV Prox   Full                                                             +---------+---------------+---------+-----------+----------+--------------+  FV Mid    Full                                                             +---------+---------------+---------+-----------+----------+--------------+  FV Distal                 Yes       Yes                                    +---------+---------------+---------+-----------+----------+--------------+  PFV       Full                                                             +---------+---------------+---------+-----------+----------+--------------+  POP       Full            Yes       Yes                                     +---------+---------------+---------+-----------+----------+--------------+  PTV       Full                                                             +---------+---------------+---------+-----------+----------+--------------+  PERO                                                       Not visualized  +---------+---------------+---------+-----------+----------+--------------+   +---------+---------------+---------+-----------+----------+--------------+  LEFT      Compressibility Phasicity Spontaneity Properties Summary         +---------+---------------+---------+-----------+----------+--------------+  CFV       Full            Yes       Yes                                    +---------+---------------+---------+-----------+----------+--------------+  SFJ       Full                                                             +---------+---------------+---------+-----------+----------+--------------+  FV Prox   Full                                                             +---------+---------------+---------+-----------+----------+--------------+  FV Mid                    Yes       Yes                                    +---------+---------------+---------+-----------+----------+--------------+  FV Distal                                                  Not visualized  +---------+---------------+---------+-----------+----------+--------------+  PFV       Full                                                             +---------+---------------+---------+-----------+----------+--------------+  POP       Full            Yes       Yes                                    +---------+---------------+---------+-----------+----------+--------------+  PTV       Full                                                             +---------+---------------+---------+-----------+----------+--------------+  PERO                                                       Not visualized   +---------+---------------+---------+-----------+----------+--------------+     Summary: Right: There is no evidence of deep vein thrombosis in the lower extremity. However, portions of this examination were limited- see technologist comments above. No cystic structure found in the popliteal fossa. Left: There is no evidence of deep vein thrombosis in the lower extremity. However, portions of this examination were limited- see technologist comments above. No cystic structure found in the popliteal fossa.  *See table(s) above for measurements and observations. Electronically signed by Sherald Hess MD on 01/18/2019 at 4:21:07 PM.    Final      Subjective: No major events overnight of this morning.  Continues to complain bilateral subcostal/rib pain and back pain.  Reports numbness and tingling in his left leg for 2 weeks.  Feels his left leg is weaker.  No bowel or bladder issues.  He has no fever.   Discharge Exam: Vitals:   01/23/19 0804 01/23/19 0833  BP: 122/63 (!) 138/54  Pulse: 85 86  Resp:    Temp:    SpO2: 94% 91%   GENERAL: Morbidly obese.  Chronically ill. HEENT: MMM.  Vision and Hearing grossly intact.  Control liter by [place]. NECK: Supple.  No JVD.  LUNGS:  No IWOB.  Fair air movement bilaterally.  Difficult exam due to body habitus. HEART:  RRR. Heart sounds normal.  ABD: Bowel sounds present. Soft. Non tender.  Limited exam due to body habitus. EXT:   no edema bilaterally.  Heel lift bilaterally.  Trace edema and venous insufficiency bilaterally. SKIN: See details above. NEURO: Awake, alert and oriented appropriately.  Motor 3+/5 in both lower extremities. Sensation diminished in left LE but stills has some.  Patellar reflex symmetric. PSYCH: Calm. Normal affect.  The results of significant diagnostics from this hospitalization (including imaging, microbiology, ancillary and laboratory) are listed below for reference.     Microbiology: Recent Results (from the past 240  hour(s))  Blood culture (routine x 2)     Status: None   Collection Time: 01/14/19 11:15 AM  Result Value Ref Range Status   Specimen Description BLOOD RIGHT WRIST  Final   Special Requests   Final    BOTTLES DRAWN AEROBIC AND ANAEROBIC Blood Culture results may not be optimal due to an inadequate volume of blood received in culture bottles   Culture   Final    NO GROWTH 5 DAYS Performed at [facility], [address] [place], [place] [address]    Report Status 01/19/2019 FINAL  Final  Blood culture (routine x 2)     Status: None   Collection Time: 01/14/19  1:25 PM  Result Value Ref Range Status   Specimen Description BLOOD RIGHT WRIST  Final   Special Requests   Final    AEROBIC BOTTLE ONLY Blood Culture results may not be optimal due to an inadequate volume of blood received in culture bottles   Culture   Final    NO GROWTH 5 DAYS Performed at [facility], [address], [place], [place] [address]    Report Status 01/19/2019 FINAL  Final  MRSA PCR Screening     Status: None  Collection Time: 01/14/19  5:37 PM  Result Value Ref Range Status   MRSA by PCR NEGATIVE NEGATIVE Final    Comment:        The GeneXpert MRSA Assay (FDA approved for NASAL specimens only), is one component of a comprehensive MRSA colonization surveillance program. It is not intended to diagnose MRSA infection nor to guide or monitor treatment for MRSA infections. Performed at [facility], [address], [place], [place] [address]   Culture, Urine     Status: None   Collection Time: 01/18/19  6:14 PM  Result Value Ref Range Status   Specimen Description URINE, RANDOM  Final   Special Requests NONE  Final   Culture   Final    NO GROWTH Performed at [facility], [address] [place], [place] [address]    Report Status 01/20/2019 FINAL  Final  Surgical pcr screen     Status: None   Collection Time: 01/22/19  5:38 AM  Result Value Ref Range Status   MRSA, PCR  NEGATIVE NEGATIVE Final   Staphylococcus aureus NEGATIVE NEGATIVE Final    Comment: (NOTE) The Xpert SA Assay (FDA approved for NASAL specimens in patients 32 years of age and older), is one component of a comprehensive surveillance program. It is not intended to diagnose infection nor to guide or monitor treatment. Performed at [facility], [address], [place], [place] [address]      Labs: BNP (last 3 results) Recent Labs    01/14/19 1059  BNP 128.3*   Basic Metabolic Panel: Recent Labs  Lab 01/17/19 0556 01/18/19 0501 01/19/19 0502 01/20/19 0646 01/21/19 0529  NA 141 141 139 138 137  K 4.4 4.4 4.2 4.5 4.2  CL 103 102 100 99 98  CO2 28 28 34* 28 30  GLUCOSE 99 92 90 94 93  BUN 42* 33* 26* 28* 26*  CREATININE 1.38* 1.30* 1.04 0.92 0.96  CALCIUM 8.4* 8.3* 8.5* 8.7* 8.5*  MG  --   --   --  2.0  --    Liver Function Tests: Recent Labs  Lab 01/17/19 0556 01/18/19 0501 01/19/19 0502 01/20/19 0646 01/21/19 0529  AST 146* 122* 84* 60* 46*  ALT 43 45* 37 32 30  ALKPHOS 61 58 52 51 52  BILITOT 1.2 1.2 1.2 1.1 1.3*  PROT 5.0* 5.4* 5.0* 5.0* 5.2*  ALBUMIN 2.0* 2.0* 1.9* 1.9* 1.9*   No results for input(s): LIPASE, AMYLASE in the last 168 hours. No results for input(s): AMMONIA in the last 168 hours. CBC: Recent Labs  Lab 01/17/19 0556 01/18/19 0501 01/20/19 0646 01/21/19 0529  WBC 12.4* 11.1* 10.7* 10.2  NEUTROABS  --   --  8.4* 8.1*  HGB 12.2* 12.0* 12.0* 11.8*  HCT 40.7 40.1 37.7* 39.0  MCV 94.0 94.4 93.1 93.5  PLT 228 238 224 235   Cardiac Enzymes: Recent Labs  Lab 01/17/19 0556 01/17/19 1927 01/18/19 0501 01/18/19 0815 01/22/19 0814  CKTOTAL 2,508* 2,264* 1,790* 1,536* 178   BNP: Invalid input(s): POCBNP CBG: Recent Labs  Lab 01/20/19 1105  GLUCAP 114*   D-Dimer No results for input(s): DDIMER in the last 72 hours. Hgb A1c No results for input(s): HGBA1C in the last 72 hours. Lipid Profile No results for input(s): CHOL,  HDL, LDLCALC, TRIG, CHOLHDL, LDLDIRECT in the last 72 hours. Thyroid function studies No results for input(s): TSH, T4TOTAL, T3FREE, THYROIDAB in the last 72 hours.  Invalid input(s): FREET3 Anemia  work up No results for input(s): VITAMINB12, FOLATE, FERRITIN, TIBC, IRON, RETICCTPCT in the last 72 hours. Urinalysis    Component Value Date/Time   COLORURINE YELLOW 01/18/2019 1557   APPEARANCEUR TURBID (A) 01/18/2019 1557   LABSPEC 1.016 01/18/2019 1557   PHURINE 5.0 01/18/2019 1557   GLUCOSEU NEGATIVE 01/18/2019 1557   HGBUR MODERATE (A) 01/18/2019 1557   BILIRUBINUR NEGATIVE 01/18/2019 1557   KETONESUR NEGATIVE 01/18/2019 1557   PROTEINUR NEGATIVE 01/18/2019 1557   NITRITE NEGATIVE 01/18/2019 1557   LEUKOCYTESUR NEGATIVE 01/18/2019 1557   Sepsis Labs Invalid input(s): PROCALCITONIN,  WBC,  LACTICIDVEN   Time coordinating discharge: 35 minutes  SIGNED:  Almon Herculesaye T Sante Biedermann, MD  [facility] 01/23/2019, 8:37 AM Pager [phone]  If 7PM-7AM, please contact night-coverage [email or web address] Password [email or web address]

## 2019-01-22 NOTE — Sedation Documentation (Signed)
Pt in IR room 2.  Pt under the care of anesthesia for entire case

## 2019-01-22 NOTE — Progress Notes (Signed)
PT Cancellation Note  Patient Details Name: William Peterson MRN: [id number] DOB: May 17, 1946   Cancelled Treatment:    Reason Eval/Treat Not Completed: Patient at procedure or test/unavailable   Ezequias Lard B Leighton Luster 01/22/2019, 8:58 AM  [name], PT Acute Rehabilitation Services Pager: [phone] Office: [phone]

## 2019-01-22 NOTE — Anesthesia Preprocedure Evaluation (Addendum)
Anesthesia Evaluation  Patient identified by MRN, date of birth, ID band Patient awake    Reviewed: Allergy & Precautions, NPO status , Patient's Chart, lab work & pertinent test results  Airway Mallampati: II  TM Distance: >3 FB Neck ROM: Full    Dental  (+) Edentulous Upper, Edentulous Lower   Pulmonary COPD,  COPD inhaler and oxygen dependent, former smoker,    Pulmonary exam normal breath sounds clear to auscultation       Cardiovascular hypertension, Pt. on home beta blockers +CHF  Normal cardiovascular exam Rhythm:Regular Rate:Normal  ECG: SR, rate 85. Sinus rhythm with frequent Premature ventricular complexes. Left axis deviation  ECHO: 1. The left ventricle has normal systolic function, with an ejection fraction of 55-60%. The cavity size was normal. There is moderately increased left ventricular wall thickness. Left ventricular diastolic function could not be evaluated. 2. The right ventricle has normal systolc function. The cavity was normal. There is no increase in right ventricular wall thickness. 3. Mild thickening of the aortic valve Mild calcification of the aortic valve. Aortic valve regurgitation is mild by color flow Doppler.   Neuro/Psych negative neurological ROS  negative psych ROS   GI/Hepatic negative GI ROS, Neg liver ROS,   Endo/Other  Morbid obesity  Renal/GU negative Renal ROS     Musculoskeletal  (+) Arthritis , Rheumatoid disorders,    Abdominal (+) + obese,   Peds  Hematology  (+) anemia , HLD    Anesthesia Other Findings COMPRESSION FRACTURE OF T6 VERTEBRAE  Reproductive/Obstetrics                            Anesthesia Physical Anesthesia Plan  ASA: IV  Anesthesia Plan: General   Post-op Pain Management:    Induction: Intravenous  PONV Risk Score and Plan: 2 and Ondansetron, Dexamethasone and Treatment may vary due to age or medical condition  Airway  Management Planned: Oral ETT  Additional Equipment:   Intra-op Plan:   Post-operative Plan: Extubation in OR and Possible Post-op intubation/ventilation  Informed Consent: I have reviewed the patients History and Physical, chart, labs and discussed the procedure including the risks, benefits and alternatives for the proposed anesthesia with the patient or authorized representative who has indicated his/her understanding and acceptance.     Dental advisory given  Plan Discussed with: CRNA  Anesthesia Plan Comments:         Anesthesia Quick Evaluation

## 2019-01-22 NOTE — Progress Notes (Signed)
Patient refusing wound care

## 2019-01-22 NOTE — Plan of Care (Signed)
  Problem: Safety: Goal: Ability to remain free from injury will improve Outcome: Progressing   Problem: Pain Managment: Goal: General experience of comfort will improve Outcome: Not Progressing  Pt. C/o pain constantly

## 2019-01-22 NOTE — Procedures (Signed)
S/P T 6 vertebroplasty under GA

## 2019-01-22 NOTE — Progress Notes (Signed)
Patient ID: AC GLADE, male   DOB: 03-30-1946, 73 y.o.   MRN: [id number] INR post procedure. Extubated. Still groggy   Moving both arms and legs spontaneously. .. S. Arlone Lenhardt MD

## 2019-01-22 NOTE — Sedation Documentation (Signed)
Pt remains under the care of anesthesia

## 2019-01-23 ENCOUNTER — Encounter (HOSPITAL_COMMUNITY): Payer: Self-pay | Admitting: Interventional Radiology

## 2019-01-23 MED ORDER — ENSURE ENLIVE PO LIQD: 237.0000 mL | Freq: Three times a day (TID) | ORAL | 1 refills | Status: AC

## 2019-01-23 MED ORDER — METHOCARBAMOL 500 MG PO TABS: 500.0000 mg | ORAL_TABLET | Freq: Three times a day (TID) | ORAL | 0 refills | Status: AC | PRN

## 2019-01-23 MED ORDER — ACETAMINOPHEN 325 MG PO TABS: 650.0000 mg | ORAL_TABLET | Freq: Three times a day (TID) | ORAL | 0 refills | Status: AC

## 2019-01-23 MED ORDER — GABAPENTIN 300 MG PO CAPS
300.0000 mg | ORAL_CAPSULE | Freq: Three times a day (TID) | ORAL | Status: DC
  Administered 2019-01-23 (×2): 300 mg via ORAL
  Filled 2019-01-23 (×2): qty 1

## 2019-01-23 MED ORDER — METHOCARBAMOL 500 MG PO TABS
500.0000 mg | ORAL_TABLET | Freq: Three times a day (TID) | ORAL | Status: DC | PRN
  Administered 2019-01-23: 500 mg via ORAL
  Filled 2019-01-23: qty 1

## 2019-01-23 MED ORDER — OXYCODONE HCL 10 MG PO TABS: 10.0000 mg | ORAL_TABLET | Freq: Four times a day (QID) | ORAL | 0 refills | Status: AC | PRN

## 2019-01-23 MED ORDER — GABAPENTIN 300 MG PO CAPS: 300.0000 mg | ORAL_CAPSULE | Freq: Three times a day (TID) | ORAL | 0 refills | Status: AC

## 2019-01-23 MED ORDER — GERHARDT'S BUTT CREAM: 1.0000 "application " | TOPICAL_CREAM | Freq: Four times a day (QID) | CUTANEOUS | Status: AC

## 2019-01-23 MED ORDER — GUAIFENESIN ER 600 MG PO TB12: 600.0000 mg | ORAL_TABLET | Freq: Two times a day (BID) | ORAL | 0 refills | Status: AC

## 2019-01-23 NOTE — Clinical Social Work Note (Signed)
CSW contacted [facility] in [place] and they no longer have a bed available. CSW to contact pt's aunt to determine second choice as pt is d/c.  Velora Mediate, LCSWA [phone]

## 2019-01-23 NOTE — TOC Transition Note (Addendum)
Transition of Care [facility]) - CM/SW Discharge Note   Patient Details  Name: William Peterson MRN: [id number] Date of Birth: [birth year]  [facility] CM/SW Contact:  Maree Krabbe, LCSW Phone Number: 01/23/2019, 9:14 AM   Clinical Narrative:    Clinical Social Worker facilitated patient discharge including contacting patient family and facility to confirm patient discharge plans.  Clinical information faxed to facility and family agreeable with plan.  CSW arranged ambulance transport via [facility] to [facility] in [place].  RN to call [phone] for report prior to discharge.   Final next level of care: Skilled Nursing Facility Barriers to Discharge: Continued Medical Work up, Other (comment)(Weight)   Patient Goals and CMS Choice Patient states their goals for this hospitalization and ongoing recovery are:: "To get where I can do something." CMS Medicare.gov Compare Post Acute Care list provided to:: Patient    Discharge Placement              Patient chooses bed at: [facility] Patient to be transferred to facility by: [facility] Name of family member notified: Emily-aunt Patient and family notified of of transfer: 01/23/19  Discharge Plan and Services     Post Acute Care Choice: Skilled Nursing Facility          DME Arranged: N/A DME Agency: NA HH Arranged: NA HH Agency: NA   Social Determinants of Health (SDOH) Interventions     Readmission Risk Interventions No flowsheet data found.

## 2019-01-23 NOTE — Progress Notes (Signed)
Patient was discharged to [facility] in [place]. Called [phone] and gave report to [facility].  Peripheral ivs removed clean dry and intact, pressure and dressing applied. Patients belongings in patients GHW:EXHBZJ, inhaler, socks, clothes and phone, pt is wearing his glasses.  [facility] arrived and used the lift to transfer pt from bed to stretcher.  Pt was on 2L nasal canula while in stretcher.  Informed the patient that CC and Onalee Hua will be there tomorrow to see him.   Packet was given to [facility] with DNR and AVS in packet.

## 2019-01-23 NOTE — Progress Notes (Signed)
Nutrition Follow-up  RD working remotely.  DOCUMENTATION CODES:   Morbid obesity  INTERVENTION:   -Continue Ensure Enlive po TID, each supplement provides 350 kcal and 20 grams of protein -MVI with minerals daily  NUTRITION DIAGNOSIS:   Increased nutrient needs related to wound healing, post-op healing as evidenced by estimated needs.  Ongoing  GOAL:   Patient will meet greater than or equal to 90% of their needs  Progressing  MONITOR:   PO intake, Supplement acceptance, Labs, Skin, Weight trends, I & O's  REASON FOR ASSESSMENT:   Low Braden    ASSESSMENT:   73 yo male, admitted after a fall. PMH significant for CHF, COPD, HTN, RA. Recent admission to [facility] 3/30-4/3 for COPD exacerbation, COVID rule-out.   4/17- T6 VP/KP aborted due to severe pain 4/20- s/p T6 VP under general anesthesia   Reviewed I/O's: +1.1 L x 24 hours and -7.8 L since admission  UOP: 850 ml x 24 hours  Pt remains with good appetite; consuming 75-100% of meals per doc flowsheets. Per MAR, pt is also consuming Ensure supplements.   Per RN notes, pt has been refusing to be turned and refusing wound care on multiple occasions.   Per CSW, likely plan to d/c to SNF today.   Labs reviewed.   Diet Order:   Diet Order            Diet - low sodium heart healthy        Diet - low sodium heart healthy        Diet full liquid Room service appropriate? Yes; Fluid consistency: Thin  Diet effective now              EDUCATION NEEDS:   No education needs have been identified at this time  Skin:  Skin Assessment: Skin Integrity Issues: Skin Integrity Issues:: Other (Comment), Incisions, DTI, Stage II DTI: buttocks Stage II: scrotum, sacrum Incisions: closed mid back Other: open puncture woubd upper vertebral column  Last BM:  01/22/19  Height:   Ht Readings from Last 1 Encounters:  01/22/19 5\' 10"  (1.778 m)    Weight:   Wt Readings from Last 1 Encounters:  01/22/19 (!) 141.5 kg     Ideal Body Weight:  72.7 kg  BMI:  Body mass index is 44.77 kg/m.  Estimated Nutritional Needs:   Kcal:  2200-2400  Protein:  130-145 grams  Fluid:  > 2.2 L   Livianna Petraglia A. Mayford Knife, RD, LDN, CDCES Registered Dietitian II Certified Diabetes Care and Education Specialist Pager: [phone] After hours Pager: [phone]

## 2019-01-23 NOTE — Progress Notes (Signed)
Referring Physician(s): Dr Ludwig Lean  Supervising Physician: Julieanne Cotton  Patient Status:  [facility] - In-pt  Chief Complaint:  T6 VP using general anesthesia 4/20 in IR  Subjective:  Pt has done well overnight Still complains of pain but no mostly in abd and chest He says the T6 VP did not help his back pain But pt was able to roll to side and let me evaluate back site Described no pain as I touched site Rolling was painful to pt; but noticeably less painful from when I first examined pt over 1 week ago    Allergies: Naproxen sodium  Medications: Prior to Admission medications   Medication Sig Start Date End Date Taking? Authorizing Provider  albuterol (PROVENTIL HFA;VENTOLIN HFA) 108 (90 Base) MCG/ACT inhaler Inhale 2 puffs into the lungs every 6 (six) hours as needed for wheezing or shortness of breath.   Yes [provider]  albuterol (PROVENTIL) (2.5 MG/3ML) 0.083% nebulizer solution Take 2.5 mg by nebulization every 6 (six) hours as needed for wheezing or shortness of breath.   Yes [provider]  atorvastatin (LIPITOR) 10 MG tablet Take 10 mg by mouth daily. 08/04/17  Yes [provider]  benzonatate (TESSALON) 100 MG capsule Take 1 capsule (100 mg total) by mouth 3 (three) times daily. 01/05/19  Yes Purohit, Salli Quarry, MD  carvedilol (COREG) 3.125 MG tablet Take 3.125 mg by mouth 2 (two) times daily. 02/17/18  Yes [provider]  dextromethorphan-guaiFENesin (MUCINEX DM) 30-600 MG 12hr tablet Take 1 tablet by mouth 2 (two) times daily for 30 days. 01/05/19 02/04/19 Yes Purohit, Salli Quarry, MD  furosemide (LASIX) 20 MG tablet Take 40 mg by mouth daily.   Yes [provider]  mometasone-formoterol (DULERA) 200-5 MCG/ACT AERO Inhale 2 puffs into the lungs 2 (two) times daily for 30 days. 01/05/19 02/04/19 Yes Purohit, Salli Quarry, MD     Vital Signs: BP 122/63   Pulse 85   Temp 98.1 F (36.7 C) (Oral)   Resp 18   Ht 5\' 10"  (1.778 m)    Wt (!) 312 lb (141.5 kg)   SpO2 94%   BMI 44.77 kg/m   Physical Exam Musculoskeletal: Normal range of motion.     Comments: Able to roll to left side easier today  Skin:    General: Skin is warm.     Comments: Site of T6 VP is clean and dry No hematoma NO pain to palpate region per pt   Neurological:     Mental Status: He is oriented to person, place, and time.  Psychiatric:        Behavior: Behavior normal.     Imaging: No results found.  Labs:  CBC: Recent Labs    01/17/19 0556 01/18/19 0501 01/20/19 0646 01/21/19 0529  WBC 12.4* 11.1* 10.7* 10.2  HGB 12.2* 12.0* 12.0* 11.8*  HCT 40.7 40.1 37.7* 39.0  PLT 228 238 224 235    COAGS: Recent Labs    01/18/19 1514 01/22/19 0814  INR 1.2 1.2  APTT  --  34    BMP: Recent Labs    01/18/19 0501 01/19/19 0502 01/20/19 0646 01/21/19 0529  NA 141 139 138 137  K 4.4 4.2 4.5 4.2  CL 102 100 99 98  CO2 28 34* 28 30  GLUCOSE 92 90 94 93  BUN 33* 26* 28* 26*  CALCIUM 8.3* 8.5* 8.7* 8.5*  CREATININE 1.30* 1.04 0.92 0.96  GFRNONAA 55* >60 >60 >60  GFRAA >  60 >60 >60 >60    LIVER FUNCTION TESTS: Recent Labs    01/18/19 0501 01/19/19 0502 01/20/19 0646 01/21/19 0529  BILITOT 1.2 1.2 1.1 1.3*  AST 122* 84* 60* 46*  ALT 45* 37 32 30  ALKPHOS 58 52 51 52  PROT 5.4* 5.0* 5.0* 5.2*  ALBUMIN 2.0* 1.9* 1.9* 1.9*    Assessment and Plan:  T6 fracture VP done with GA yesterday in IR Less pain to maneuver in the bed   Electronically Signed: Robet Leu, PA-C 01/23/2019, 8:24 AM   I spent a total of 15 Minutes at the the patient's bedside AND on the patient's hospital floor or unit, greater than 50% of which was counseling/coordinating care for T6 VP

## 2019-01-23 NOTE — Progress Notes (Addendum)
Pt. C/o numbness and burning in L toes, posterior foot.. States he's unable to wiggle them. Pt. States has been ongoing for 2 days. On call for [facility] paged to make aware.

## 2019-01-23 NOTE — Progress Notes (Signed)
PT Cancellation Note  Patient Details Name: William Peterson MRN: [id number] DOB: Jun 21, 1946   Cancelled Treatment:    Reason Eval/Treat Not Completed: Patient declined, no reason specified(pt report being moved around the bed all day and refused HEP or tilt at this time)   [name] B [name] 01/23/2019, 12:02 PM [name], PT Acute Rehabilitation Services Pager: [phone] Office: [phone]

## 2019-01-23 NOTE — Progress Notes (Signed)
Pt stated that was having chest pain this morning and his back was hurting. Vitals were taken and EKG was done and place in pts chart.    01/23/19 0804  Vitals  BP 122/63  MAP (mmHg) 79  BP Method Automatic  Pulse Rate 85  Pulse Rate Source Monitor  Oxygen Therapy  SpO2 94 %  MEWS Score  MEWS RR 0  MEWS Pulse 0  MEWS Systolic 0  MEWS LOC 0  MEWS Temp 0  MEWS Score 0  MEWS Score Color Green     01/23/19 0833  Vitals  BP (!) 138/54  MAP (mmHg) 80  BP Method Automatic  Pulse Rate 86  Pulse Rate Source Monitor  Oxygen Therapy  SpO2 91 %  MEWS Score  MEWS RR 0  MEWS Pulse 0  MEWS Systolic 0  MEWS LOC 0  MEWS Temp 0  MEWS Score 0  MEWS Score Color Green   md paged to make aware.

## 2019-01-24 ENCOUNTER — Encounter (HOSPITAL_COMMUNITY): Payer: Self-pay

## 2019-01-26 ENCOUNTER — Encounter (HOSPITAL_COMMUNITY): Payer: Self-pay | Admitting: Interventional Radiology

## 2019-02-07 ENCOUNTER — Encounter (HOSPITAL_COMMUNITY): Payer: Self-pay | Admitting: Interventional Radiology

## 2019-03-05 DEATH — deceased

## 2020-09-05 IMAGING — CR LUMBAR SPINE - COMPLETE 4+ VIEW
5 series · 5 of 5 positions shown · non-contrast
Comparison: Lumbar spine CT 09/18/2008.

CLINICAL DATA: Fall last night.  Back and left hip pain.

EXAM:
LUMBAR SPINE - COMPLETE 4+ VIEW

[l-spine ap]
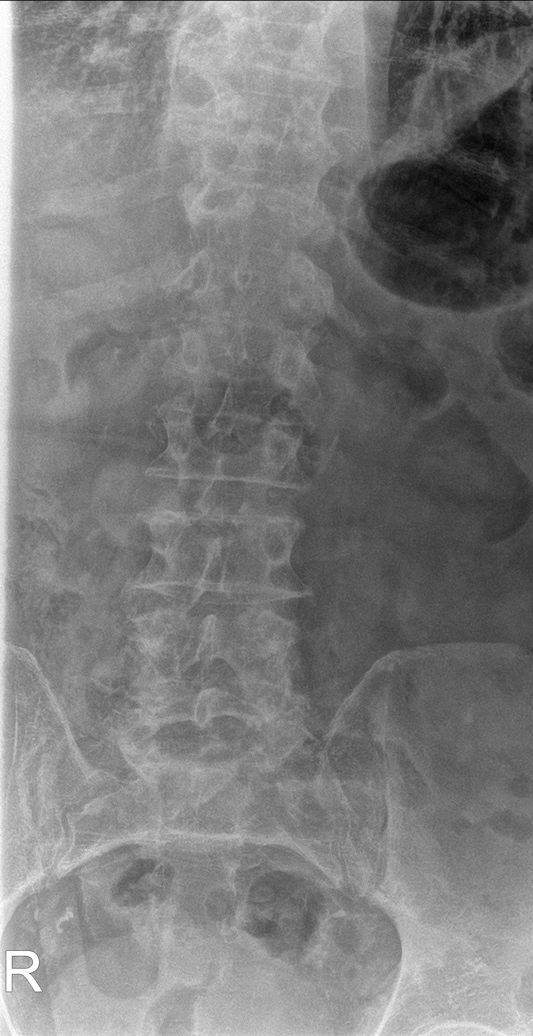

[l-spine obl (1 of 2)]
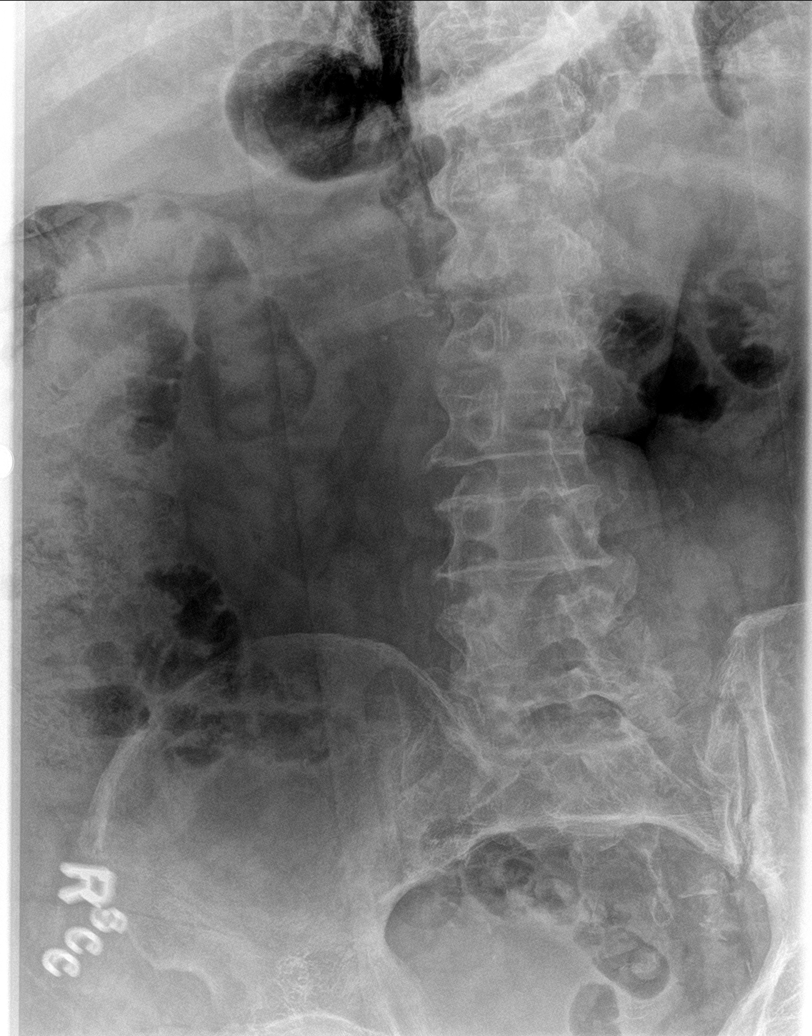

[l-spine obl (2 of 2)]
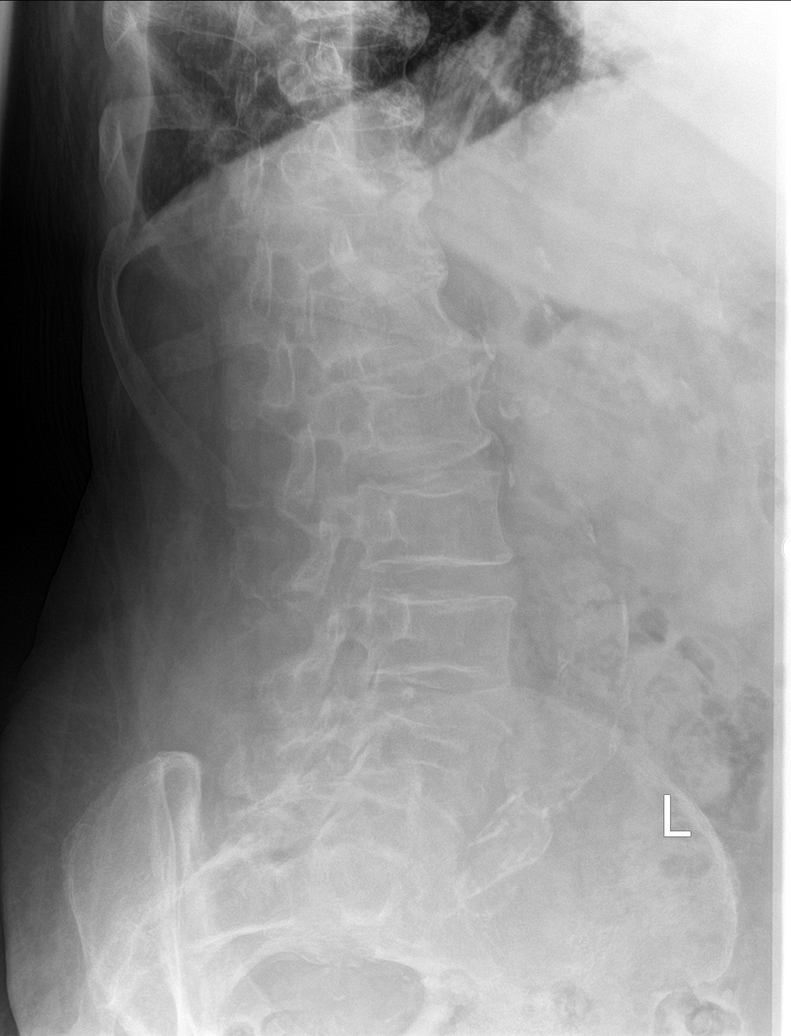

[l-spine lat]
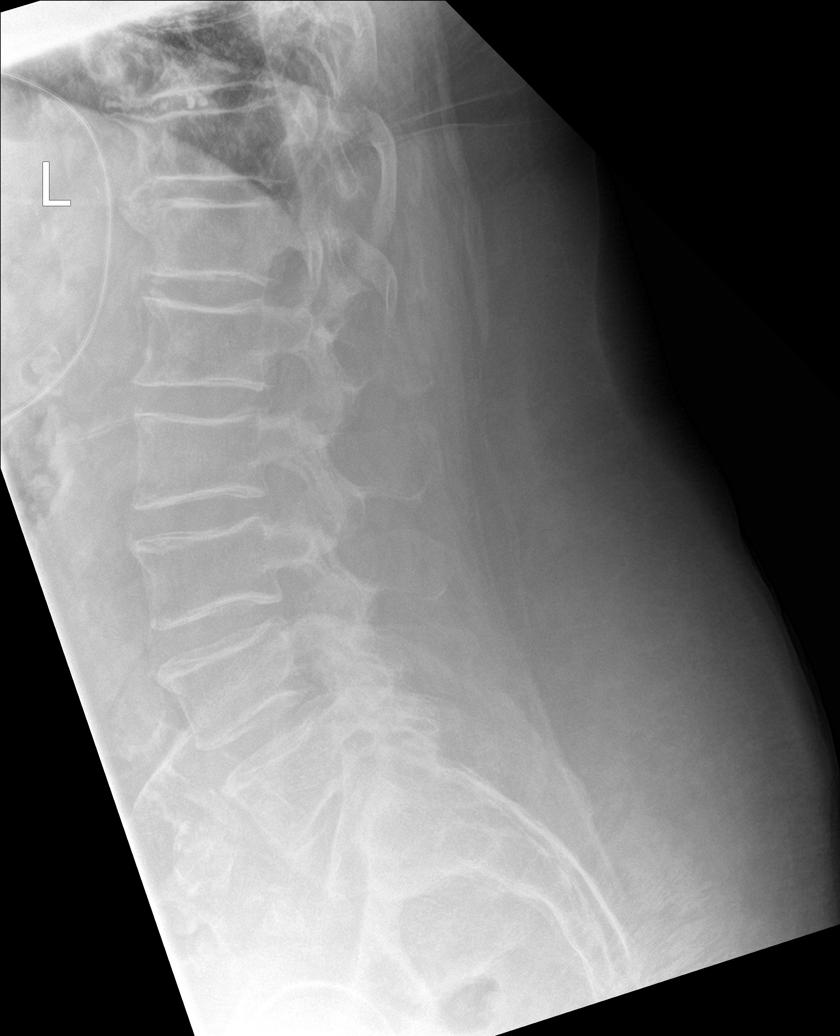

[l-spine spot]
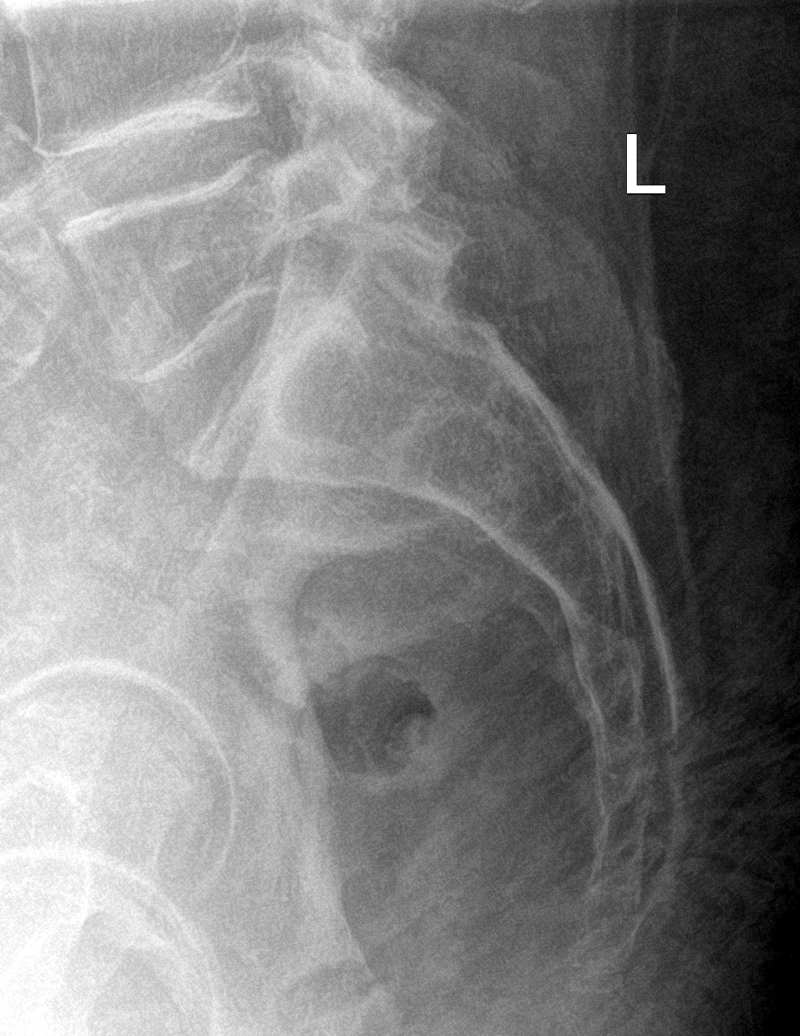

[5 of 5 positions shown; findings below may reference images not displayed]

FINDINGS: There are 5 lumbar type vertebral bodies. The bones are mildly
demineralized. No evidence of acute fracture or pars defect. The
disc spaces are preserved. Moderate facet degenerative changes are
present inferiorly. There are bilateral sacroiliac degenerative
changes. There is diffuse aortic atherosclerosis.
IMPRESSION: No evidence of acute lumbar spine injury. Facet arthropathy and
aortic atherosclerosis noted.

## 2020-09-10 IMAGING — XA IR FLUORO RM 0-60 MIN
1 series · 4 of 4 positions shown · IV contrast (agent unspecified)
Comparison: none

CLINICAL DATA: Severe midthoracic pain secondary to compression
fracture at T6.

EXAM:
IR FLUORO RM 0-60 MIN
ANESTHESIA/SEDATION:
None
MEDICATIONS:
CONTRAST:  None
PROCEDURE:
Biplane fluoroscopy
COMPLICATIONS:
None immediate

[Series 300: spine · 4 of 4 slices shown]
[im 1/4]
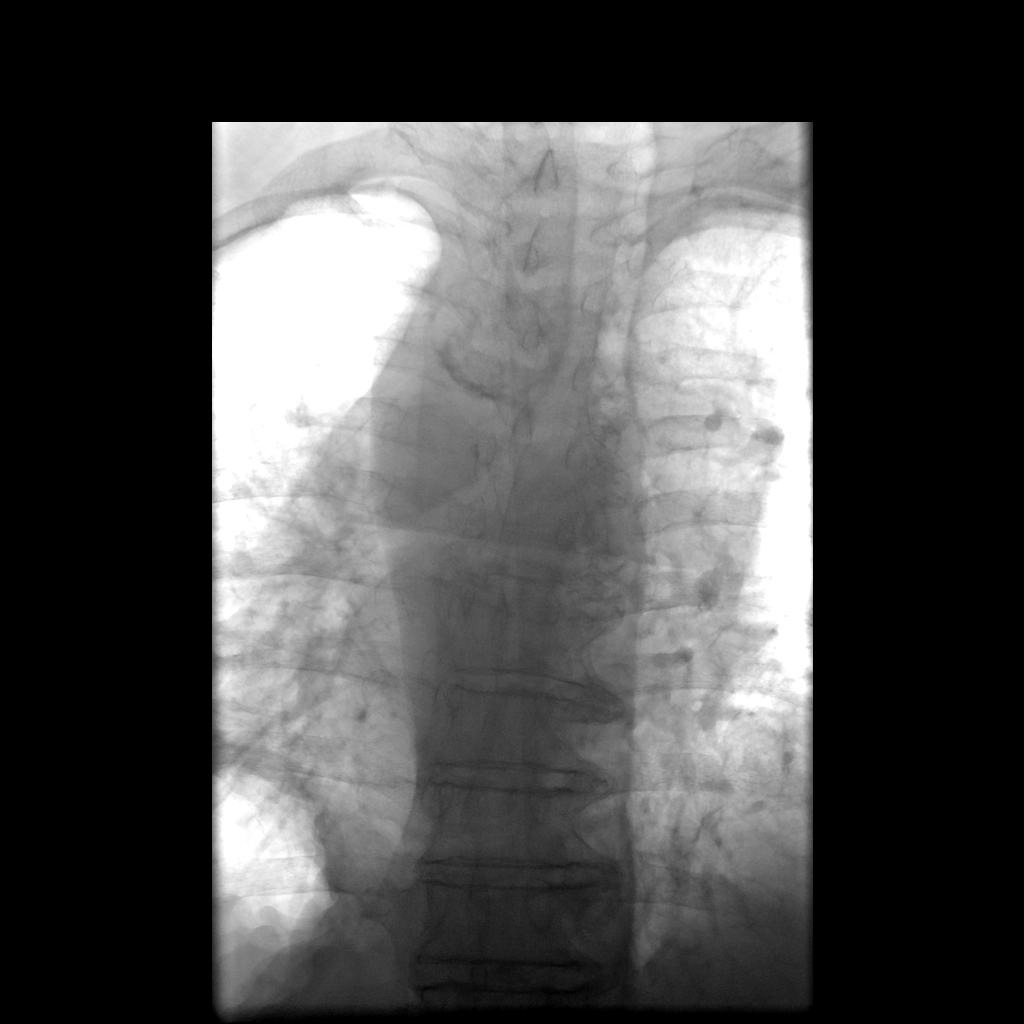
[im 2/4]
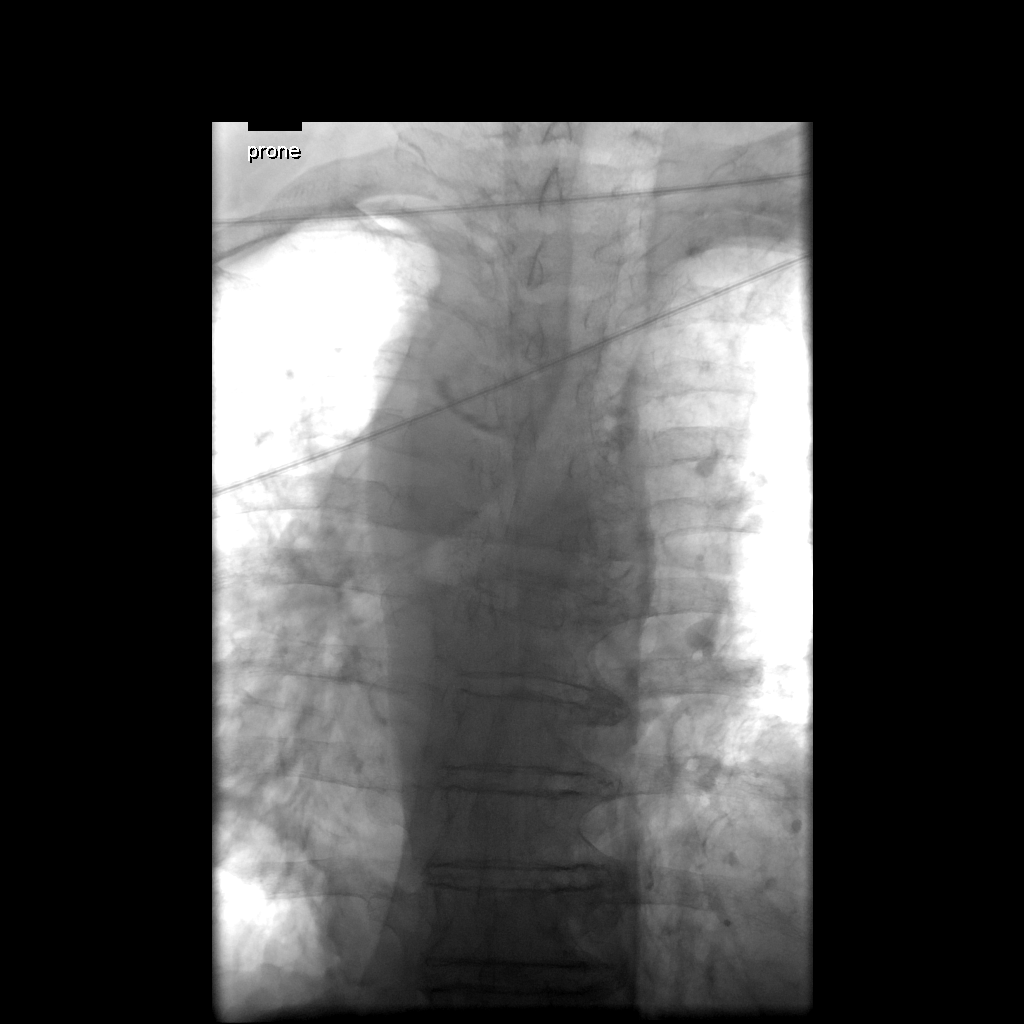
[im 3/4]
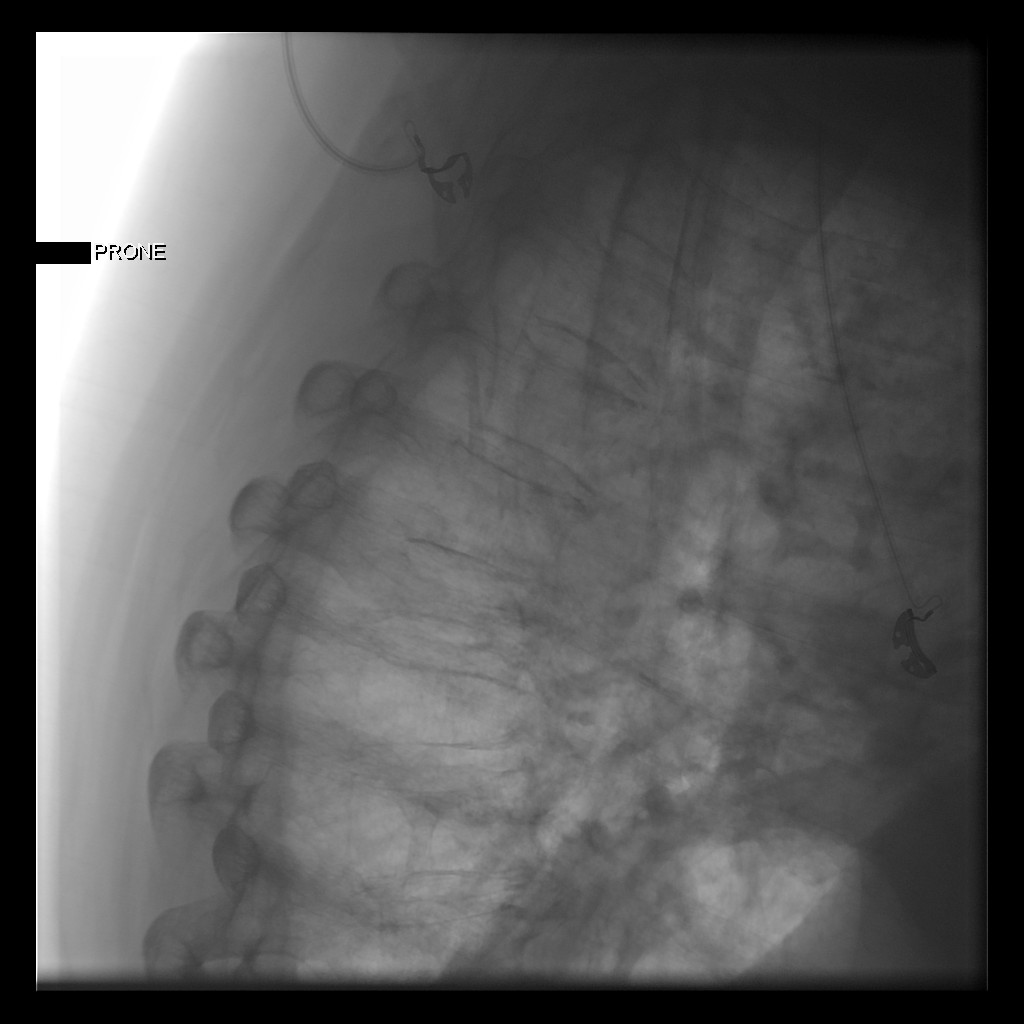
[im 4/4]
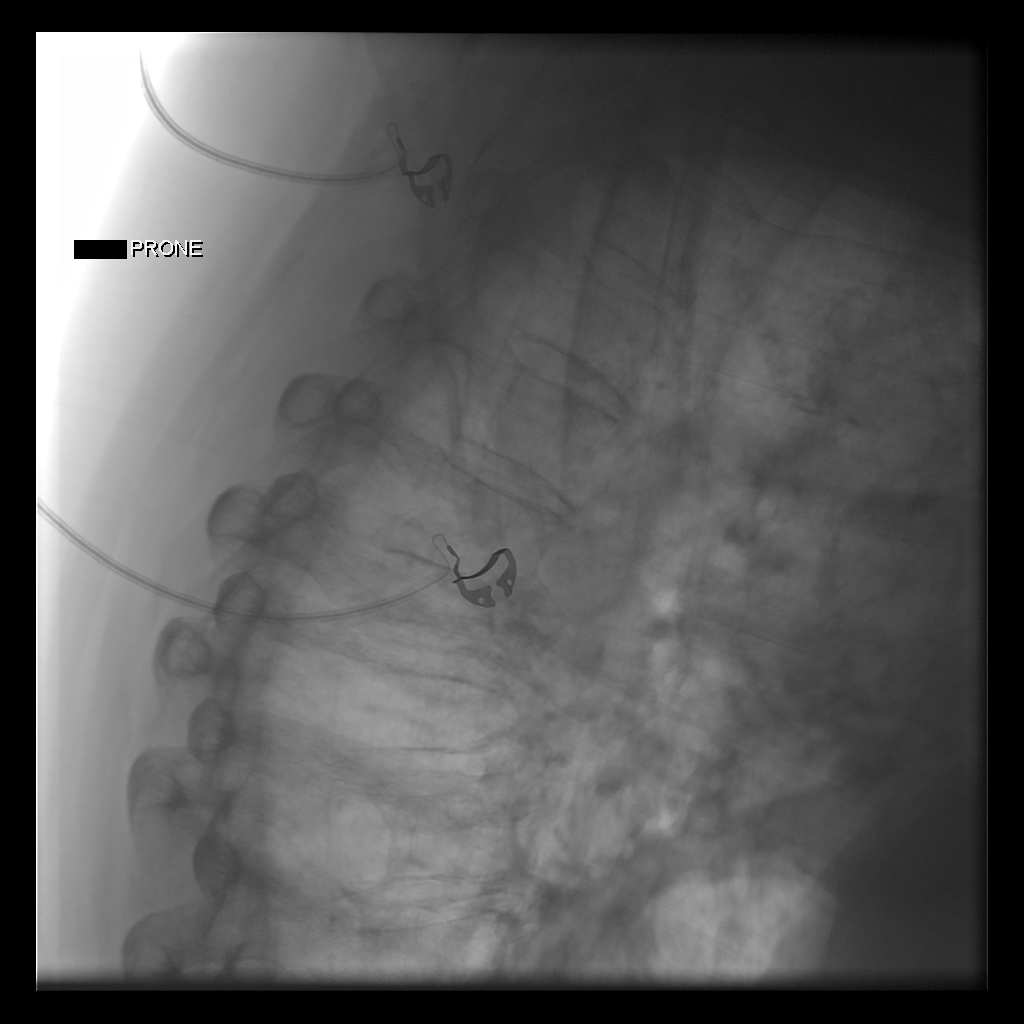

[4 of 4 positions shown; findings below may reference images not displayed]

FINDINGS: Spot pictures of the AP and lateral thoracic spine demonstrate
severe compression deformity at T6 with poor visualization secondary
to patient motion, and patient habitus.
IMPRESSION: Procedure aborted in view of the patient being in severe pain
causing the patient to be persistently moving. Also patient habitus
preventing adequate visualization of the T6 vertebral body at this
time.
